# Patient Record
Sex: Female | Born: 1995 | Race: White | Hispanic: No
Health system: Western US, Academic
[De-identification: ages and names within clinical notes are randomized; demographics above are authoritative.]

## PROBLEM LIST (undated history)

## (undated) DIAGNOSIS — R51 Headache: Secondary | ICD-10-CM

## (undated) DIAGNOSIS — Z973 Presence of spectacles and contact lenses: Secondary | ICD-10-CM

## (undated) DIAGNOSIS — J302 Other seasonal allergic rhinitis: Secondary | ICD-10-CM

## (undated) DIAGNOSIS — K219 Gastro-esophageal reflux disease without esophagitis: Secondary | ICD-10-CM

## (undated) DIAGNOSIS — R519 Headache, unspecified: Secondary | ICD-10-CM

## (undated) DIAGNOSIS — T7840XA Allergy, unspecified, initial encounter: Secondary | ICD-10-CM

## (undated) DIAGNOSIS — J189 Pneumonia, unspecified organism: Secondary | ICD-10-CM

## (undated) HISTORY — PX: MOUTH SURGERY: SHX715

## (undated) HISTORY — DX: Allergy, unspecified, initial encounter: T78.40XA

## (undated) HISTORY — PX: PAROTIDECTOMY: SHX2163

---

## 2013-01-05 ENCOUNTER — Encounter (HOSPITAL_COMMUNITY): Payer: Self-pay | Admitting: *Deleted

## 2013-01-05 DIAGNOSIS — R6883 Chills (without fever): Secondary | ICD-10-CM | POA: Insufficient documentation

## 2013-01-05 DIAGNOSIS — M545 Low back pain, unspecified: Secondary | ICD-10-CM | POA: Insufficient documentation

## 2013-01-05 DIAGNOSIS — R300 Dysuria: Secondary | ICD-10-CM | POA: Insufficient documentation

## 2013-01-05 DIAGNOSIS — R350 Frequency of micturition: Secondary | ICD-10-CM | POA: Insufficient documentation

## 2013-01-05 DIAGNOSIS — N390 Urinary tract infection, site not specified: Secondary | ICD-10-CM | POA: Insufficient documentation

## 2013-01-05 DIAGNOSIS — Z3202 Encounter for pregnancy test, result negative: Secondary | ICD-10-CM | POA: Insufficient documentation

## 2013-01-05 LAB — POCT PREGNANCY, URINE: Preg Test, Ur: NEGATIVE

## 2013-01-05 NOTE — ED Notes (Addendum)
Lt. Lower back pain; the pain started in stomach. Urine did have a foul odor x 2 weeks ago and cloudy but resolved.

## 2013-01-06 ENCOUNTER — Emergency Department (HOSPITAL_COMMUNITY)
Admission: EM | Admit: 2013-01-06 | Discharge: 2013-01-06 | Disposition: A | Discharge status: Home / Self Care | Payer: Medicaid Other | Attending: Emergency Medicine | Admitting: Emergency Medicine

## 2013-01-06 DIAGNOSIS — N390 Urinary tract infection, site not specified: Secondary | ICD-10-CM

## 2013-01-06 LAB — URINALYSIS, ROUTINE W REFLEX MICROSCOPIC
Glucose, UA: NEGATIVE mg/dL
Protein, ur: NEGATIVE mg/dL
Specific Gravity, Urine: 1.007 (ref 1.005–1.030)

## 2013-01-06 LAB — URINE MICROSCOPIC-ADD ON

## 2013-01-06 MED ORDER — NITROFURANTOIN MONOHYD MACRO 100 MG PO CAPS: 100.0000 mg | ORAL_CAPSULE | Freq: Two times a day (BID) | ORAL | Status: DC

## 2013-01-06 NOTE — ED Notes (Signed)
Pt states understanding of discharge instructions 

## 2013-01-06 NOTE — ED Provider Notes (Signed)
History     CSN: [id number]  Arrival date & time 01/05/13  2318   First MD Initiated Contact with Patient 01/06/13 0121      Chief Complaint  Patient presents with  . Abdominal Pain   HPI  History provided by the patient. Patient is 17 year old female with no significant PMH who presents with complaints of left lower abdomen and back pains and discomfort. Patient also reports having a foul odor and slight dysuria for the past 2 weeks. Back pains and abdominal pains began over the past one to 2 days. Patient has not used any treatments for her symptoms. She denies any other aggravating or alleviating factors. Denies any menstrual changes. Last menstrual period was on the 11th. Denies any current vaginal bleeding or discharge. No other associated symptoms. No fever, chills or sweats. No nausea or vomiting.    History reviewed. No pertinent past medical history.  History reviewed. No pertinent past surgical history.  No family history on file.  History  Substance Use Topics  . Smoking status: Never Smoker   . Smokeless tobacco: Not on file  . Alcohol Use: No    OB History   Grav Para Term Preterm Abortions TAB SAB Ect Mult Living                  Review of Systems  Constitutional: Positive for chills. Negative for fever and diaphoresis.  Gastrointestinal: Positive for abdominal pain. Negative for nausea, vomiting and constipation.  Genitourinary: Positive for dysuria, frequency and flank pain. Negative for hematuria.  Musculoskeletal: Positive for back pain.  All other systems reviewed and are negative.    Allergies  Doxycycline  Home Medications   Current Outpatient Rx  Name  Route  Sig  Dispense  Refill  . Drospiren-Eth Estrad-Levomefol (BEYAZ PO)   Oral   Take 1 tablet by mouth daily.         Marland Kitchen ibuprofen (ADVIL,MOTRIN) 200 MG tablet   Oral   Take 400 mg by mouth every 6 (six) hours as needed for pain.           BP 140/89  Pulse 78  Temp(Src) 98.2  F (36.8 C) (Oral)  Resp 20  SpO2 100%  LMP 12/21/2012  Physical Exam  Nursing note and vitals reviewed. Constitutional: She is oriented to person, place, and time. She appears well-developed and well-nourished. No distress.  HENT:  Head: Normocephalic.  Cardiovascular: Normal rate and regular rhythm.   Pulmonary/Chest: Effort normal and breath sounds normal.  Abdominal: Soft. There is tenderness in the left lower quadrant. There is no rebound, no guarding, no CVA tenderness, no tenderness at McBurney's point and negative Murphy's sign.  Pain is mild. No significance left CVA tenderness.  Musculoskeletal: Normal range of motion.  Neurological: She is alert and oriented to person, place, and time.  Skin: Skin is warm and dry. No rash noted.  Psychiatric: She has a normal mood and affect. Her behavior is normal.    ED Course  Procedures   Results for orders placed during the hospital encounter of 01/06/13  URINALYSIS, ROUTINE W REFLEX MICROSCOPIC      Result Value Range   Color, Urine YELLOW  YELLOW   APPearance CLEAR  CLEAR   Specific Gravity, Urine 1.007  1.005 - 1.030   pH 7.0  5.0 - 8.0   Glucose, UA NEGATIVE  NEGATIVE mg/dL   Hgb urine dipstick MODERATE (*) NEGATIVE   Bilirubin Urine NEGATIVE  NEGATIVE   Ketones,  ur NEGATIVE  NEGATIVE mg/dL   Protein, ur NEGATIVE  NEGATIVE mg/dL   Urobilinogen, UA 0.2  0.0 - 1.0 mg/dL   Nitrite NEGATIVE  NEGATIVE   Leukocytes, UA MODERATE (*) NEGATIVE  URINE MICROSCOPIC-ADD ON      Result Value Range   Squamous Epithelial / LPF MANY (*) RARE   WBC, UA 11-20  <3 WBC/hpf   RBC / HPF 7-10  <3 RBC/hpf   Bacteria, UA FEW (*) RARE  POCT PREGNANCY, URINE      Result Value Range   Preg Test, Ur NEGATIVE  NEGATIVE        1. UTI (lower urinary tract infection)       MDM  1:20AM patient seen and evaluated. Patient well-appearing in no acute distress. She is not appears fairly or toxic. Symptoms may suggest slight early  pyelonephritis though patient clinically does not appear to have this yet. She denies any vaginal complaints does not wish to have pelvic examination for other causes. Will treat as UTI and patient will followup with her doctors.        Angus Seller, PA-C 01/06/13 [phone]

## 2013-01-07 LAB — URINE CULTURE

## 2013-01-07 NOTE — ED Provider Notes (Signed)
Medical screening examination/treatment/procedure(s) were performed by non-physician practitioner and as supervising physician I was immediately available for consultation/collaboration.  John-Adam Aundrey Elahi, M.D.   John-Adam Izell Labat, MD 01/07/13 0749 

## 2013-01-08 ENCOUNTER — Telehealth (HOSPITAL_COMMUNITY): Payer: Self-pay | Admitting: Emergency Medicine

## 2013-01-08 NOTE — ED Notes (Signed)
Results received from [facility]. (+) [facility].  Rx given for Nitrofurantoin -> No sensitivities provided.  Chart to MD office for review.

## 2013-01-12 ENCOUNTER — Telehealth (HOSPITAL_COMMUNITY): Payer: Self-pay | Admitting: Emergency Medicine

## 2013-01-12 NOTE — ED Notes (Signed)
Chart returned from EDP office. Per Emily West PA-C, likely contaminant. No further abx tx. °

## 2013-11-28 ENCOUNTER — Encounter (HOSPITAL_COMMUNITY): Payer: Self-pay | Admitting: Emergency Medicine

## 2013-11-28 ENCOUNTER — Emergency Department (HOSPITAL_COMMUNITY)
Admission: EM | Admit: 2013-11-28 | Discharge: 2013-11-28 | Disposition: A | Discharge status: Home / Self Care | Payer: BC Managed Care – PPO | Source: Home / Self Care | Attending: Family Medicine | Admitting: Family Medicine

## 2013-11-28 DIAGNOSIS — J029 Acute pharyngitis, unspecified: Secondary | ICD-10-CM

## 2013-11-28 DIAGNOSIS — J309 Allergic rhinitis, unspecified: Secondary | ICD-10-CM

## 2013-11-28 LAB — POCT RAPID STREP A: Streptococcus, Group A Screen (Direct): NEGATIVE

## 2013-11-28 MED ORDER — PREDNISONE 10 MG PO TABS: ORAL_TABLET | ORAL | Status: DC

## 2013-11-28 NOTE — ED Notes (Signed)
Pt c/o sore throat onset Sunday Also states she was nauseas beg of this week and started to vomit Denies f/d Taking cough drops w/no relief Alert w/no signs of acute distress.

## 2013-11-28 NOTE — Discharge Instructions (Signed)
Sore Throat Warm salt water gargles daily. 1 tsp liquid benadryl + 1 tsp liquid Maalox, MIX/ GARGLE/ SPIT as needed for pain  A sore throat is a painful, burning, sore, or scratchy feeling of the throat. There may be pain or tenderness when swallowing or talking. You may have other symptoms with a sore throat. These include coughing, sneezing, fever, or a swollen neck. A sore throat is often the first sign of another sickness. These sicknesses may include a cold, flu, strep throat, or an infection called mono. Most sore throats go away without medical treatment.  HOME CARE   Only take medicine as told by your doctor.  Drink enough fluids to keep your pee (urine) clear or pale yellow.  Rest as needed.  Try using throat sprays, lozenges, or suck on hard candy (if older than 4 years or as told).  Sip warm liquids, such as broth, herbal tea, or warm water with honey. Try sucking on frozen ice pops or drinking cold liquids.  Rinse the mouth (gargle) with salt water. Mix 1 teaspoon salt with 8 ounces of water.  Do not smoke. Avoid being around others when they are smoking.  Put a humidifier in your bedroom at night to moisten the air. You can also turn on a hot shower and sit in the bathroom for 5 10 minutes. Be sure the bathroom door is closed. GET HELP RIGHT AWAY IF:   You have trouble breathing.  You cannot swallow fluids, soft foods, or your spit (saliva).  You have more puffiness (swelling) in the throat.  Your sore throat does not get better in 7 days.  You feel sick to your stomach (nauseous) and throw up (vomit).  You have a fever or lasting symptoms for more than 2 3 days.  You have a fever and your symptoms suddenly get worse. MAKE SURE YOU:   Understand these instructions.  Will watch your condition.  Will get help right away if you are not doing well or get worse. Document Released: 06/07/2008 Document Revised: 05/23/2012 Document Reviewed: 05/06/2012 [facility]  Patient Information 2014 [place], [place]. Allergic Rhinitis Allergic rhinitis is when the mucous membranes in the nose respond to allergens. Allergens are particles in the air that cause your body to have an allergic reaction. This causes you to release allergic antibodies. Through a chain of events, these eventually cause you to release histamine into the blood stream. Although meant to protect the body, it is this release of histamine that causes your discomfort, such as frequent sneezing, congestion, and an itchy, runny nose.  CAUSES  Seasonal allergic rhinitis (hay fever) is caused by pollen allergens that may come from grasses, trees, and weeds. Year-round allergic rhinitis (perennial allergic rhinitis) is caused by allergens such as house dust mites, pet dander, and mold spores.  SYMPTOMS   Nasal stuffiness (congestion).  Itchy, runny nose with sneezing and tearing of the eyes. DIAGNOSIS  Your health care provider can help you determine the allergen or allergens that trigger your symptoms. If you and your health care provider are unable to determine the allergen, skin or blood testing may be used. TREATMENT  Allergic Rhinitis does not have a cure, but it can be controlled by:  Medicines and allergy shots (immunotherapy).  Avoiding the allergen. Hay fever may often be treated with antihistamines in pill or nasal spray forms. Antihistamines block the effects of histamine. There are over-the-counter medicines that may help with nasal congestion and swelling around the eyes. Check with your  health care provider before taking or giving this medicine.  If avoiding the allergen or the medicine prescribed do not work, there are many new medicines your health care provider can prescribe. Stronger medicine may be used if initial measures are ineffective. Desensitizing injections can be used if medicine and avoidance does not work. Desensitization is when a patient is given ongoing shots until the body  becomes less sensitive to the allergen. Make sure you follow up with your health care provider if problems continue. HOME CARE INSTRUCTIONS It is not possible to completely avoid allergens, but you can reduce your symptoms by taking steps to limit your exposure to them. It helps to know exactly what you are allergic to so that you can avoid your specific triggers. SEEK MEDICAL CARE IF:   You have a fever.  You develop a cough that does not stop easily (persistent).  You have shortness of breath.  You start wheezing.  Symptoms interfere with normal daily activities. Document Released: 05/24/2001 Document Revised: 06/19/2013 Document Reviewed: 05/06/2013 [facility] Patient Information 2014 [place].

## 2013-11-28 NOTE — ED Provider Notes (Signed)
CSN: [id number]     Arrival date & time 11/28/13  1507 History   First MD Initiated Contact with Patient 11/28/13 1719     Chief Complaint  Patient presents with  . Sore Throat   (Consider location/radiation/quality/duration/timing/severity/associated sxs/prior Treatment) HPI Comments: 18 yo with sore throat/ HA since Sunday. She noted mild nausea and vomitus which have improved. She denies relief with Ibuprofen. She has felt mild fever/ chills. She denies appetite change and is drinking fluids.    History reviewed. No pertinent past medical history. Past Surgical History  Procedure Laterality Date  . Mouth surgery     No family history on file. History  Substance Use Topics  . Smoking status: Never Smoker   . Smokeless tobacco: Not on file  . Alcohol Use: No   OB History   Grav Para Term Preterm Abortions TAB SAB Ect Mult Living                 Review of Systems  Constitutional: Positive for fever and fatigue.  HENT: Positive for postnasal drip and sore throat.   Gastrointestinal: Positive for nausea.  All other systems reviewed and are negative.    Allergies  Doxycycline  Home Medications   Current Outpatient Rx  Name  Route  Sig  Dispense  Refill  . Drospiren-Eth Estrad-Levomefol (BEYAZ PO)   Oral   Take 1 tablet by mouth daily.         Marland Kitchen ibuprofen (ADVIL,MOTRIN) 200 MG tablet   Oral   Take 400 mg by mouth every 6 (six) hours as needed for pain.         . nitrofurantoin, macrocrystal-monohydrate, (MACROBID) 100 MG capsule   Oral   Take 1 capsule (100 mg total) by mouth 2 (two) times daily. X 7 days   14 capsule   0    BP 130/75  Pulse 68  Temp(Src) 97.7 F (36.5 C) (Oral)  Resp 12  SpO2 100%  LMP 11/19/2013 Physical Exam  Nursing note and vitals reviewed. Constitutional: She is oriented to person, place, and time. She appears well-developed and well-nourished.  HENT:  Head: Normocephalic and atraumatic.  Right Ear: External ear normal.   Left Ear: External ear normal.  Nose: Nose normal.  Mouth/Throat: Oropharynx is clear and moist. No oropharyngeal exudate.  Cloudy TM's bilaterally Cobbl[place] posterior pharynx NO exudate/ erythema  Eyes: Conjunctivae and EOM are normal.  Neck: Normal range of motion.  Cardiovascular: Normal rate, regular rhythm, normal heart sounds and intact distal pulses.   Pulmonary/Chest: Effort normal and breath sounds normal.  Musculoskeletal: Normal range of motion.  Lymphadenopathy:    She has no cervical adenopathy.  Neurological: She is alert and oriented to person, place, and time.  Skin: Skin is warm and dry.  Psychiatric: She has a normal mood and affect. Judgment normal.    ED Course  Procedures (including critical care time) Labs Review Labs Reviewed  POCT RAPID STREP A ([facility] URG CARE ONLY)   Imaging Review No results found.   MDM  Viral infection/ Sore throat/ Allergic rhinitis- Allegra OTC, increase H2o, allergy hygiene explained. Strept NEG will send for culture. Advised of hygiene, increase fluids, Warm salt water gargles daily. 1 tsp liquid benadryl + 1 tsp liquid Maalox, MIX/ GARGLE/ SPIT as needed for pain    Ardis Hughs, PA-C 11/28/13 2108

## 2013-11-29 NOTE — ED Provider Notes (Signed)
Medical screening examination/treatment/procedure(s) were performed by resident physician or non-physician practitioner and as supervising physician I was immediately available for consultation/collaboration.   Pauline Good MD.   Billy Fischer, MD 11/29/13 [phone]

## 2013-11-30 LAB — CULTURE, GROUP A STREP

## 2014-03-17 ENCOUNTER — Ambulatory Visit (INDEPENDENT_AMBULATORY_CARE_PROVIDER_SITE_OTHER): Payer: BC Managed Care – PPO | Admitting: Physician Assistant

## 2014-03-17 VITALS — BP 118/72 | HR 72 | Temp 98.1°F | Resp 16 | Ht 63.0 in | Wt 117.6 lb

## 2014-03-17 DIAGNOSIS — M25561 Pain in right knee: Secondary | ICD-10-CM

## 2014-03-17 DIAGNOSIS — M25569 Pain in unspecified knee: Secondary | ICD-10-CM

## 2014-03-17 DIAGNOSIS — M25571 Pain in right ankle and joints of right foot: Secondary | ICD-10-CM

## 2014-03-17 DIAGNOSIS — M25579 Pain in unspecified ankle and joints of unspecified foot: Secondary | ICD-10-CM

## 2014-03-17 MED ORDER — MELOXICAM 15 MG PO TABS: 15.0000 mg | ORAL_TABLET | Freq: Every day | ORAL | Status: DC

## 2014-03-17 NOTE — Progress Notes (Signed)
   Subjective:    Patient ID: Tracy West, female    DOB: 1996-04-20, 18 y.o.   MRN: [id number]  HPI 18  year old female presents for evaluation of right foot and knee pain. States she has had knee pain for about 4 years intermittently. Wears a brace as needed and will also take Advil prn severe pain. Has never had this evaluated. No known injury.  She was doing ok until about 1 week ago when the pain increased. 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   Also had and episode of pain in her right 2nd toe. No known injury. Does admit that over the past week her pain has improved somewhat. States she feels like the pain radiates up her leg and to her knee.  No limping or change in gait. Denies any weakness or paresthesias. No back pain. Has full ROM.    She is otherwise healthy with no other concerns today.  No daily medications.   Review of Systems  Musculoskeletal: Positive for arthralgias. Negative for joint swelling.  Skin: Negative for rash.  Neurological: Negative for weakness and numbness.       Objective:   Physical Exam  Constitutional: She is oriented to person, place, and time. She appears well-developed and well-nourished.  HENT:  Head: Normocephalic and atraumatic.  Right Ear: External ear normal.  Left Ear: External ear normal.  Eyes: Conjunctivae are normal.  Neck: Normal range of motion.  Cardiovascular: Normal rate.   Pulmonary/Chest: Effort normal.  Musculoskeletal:       Right knee: She exhibits normal range of motion, no swelling, no effusion, no ecchymosis, no deformity, no laceration, no erythema, normal alignment, no LCL laxity, normal patellar mobility and no bony tenderness. No tenderness found. No medial joint line, no lateral joint line, no MCL, no LCL and no patellar tendon tenderness noted.       Legs: Pain in noted area when ambulating. Not reproducible on exam. Right foot +TTP at 2nd MTP joint. Not point tender.  Full ROM without pain.  Capillary refill normal <2 seconds  Neurological: She is alert and oriented to person, place, and time.  Psychiatric: She has a normal mood and affect. Her behavior is normal. Judgment and thought content normal.          Assessment & Plan:  Knee pain, acute, right - Plan: meloxicam (MOBIC) 15 MG tablet  Pain in joint, ankle and foot, right  Plan to treat conservatively with Mobic 15 mg daily with food Continue to wear knee brace as needed.  Ice, elevate, and rest Recheck in 10-14 days if no improvement, sooner if worse. Consider x-rays/ortho eval at that time.

## 2014-11-17 ENCOUNTER — Other Ambulatory Visit: Payer: Self-pay | Admitting: Family Medicine

## 2014-11-17 DIAGNOSIS — R200 Anesthesia of skin: Secondary | ICD-10-CM

## 2014-11-17 DIAGNOSIS — R202 Paresthesia of skin: Principal | ICD-10-CM

## 2014-11-20 ENCOUNTER — Ambulatory Visit
Admission: RE | Admit: 2014-11-20 | Discharge: 2014-11-20 | Disposition: A | Discharge status: Home / Self Care | Payer: BLUE CROSS/BLUE SHIELD | Source: Ambulatory Visit | Attending: Family Medicine | Admitting: Family Medicine

## 2014-11-20 ENCOUNTER — Other Ambulatory Visit: Payer: Self-pay | Admitting: Family Medicine

## 2014-11-20 DIAGNOSIS — R202 Paresthesia of skin: Principal | ICD-10-CM

## 2014-11-20 DIAGNOSIS — M79602 Pain in left arm: Secondary | ICD-10-CM

## 2014-11-20 DIAGNOSIS — S0300XA Dislocation of jaw, unspecified side, initial encounter: Secondary | ICD-10-CM

## 2014-11-20 DIAGNOSIS — R200 Anesthesia of skin: Secondary | ICD-10-CM

## 2014-11-24 ENCOUNTER — Other Ambulatory Visit: Payer: Self-pay | Admitting: Family Medicine

## 2014-11-24 DIAGNOSIS — IMO0002 Reserved for concepts with insufficient information to code with codable children: Secondary | ICD-10-CM

## 2014-11-24 DIAGNOSIS — R229 Localized swelling, mass and lump, unspecified: Principal | ICD-10-CM

## 2014-11-27 ENCOUNTER — Ambulatory Visit
Admission: RE | Admit: 2014-11-27 | Discharge: 2014-11-27 | Disposition: A | Discharge status: Home / Self Care | Payer: BLUE CROSS/BLUE SHIELD | Source: Ambulatory Visit | Attending: Family Medicine | Admitting: Family Medicine

## 2014-11-27 DIAGNOSIS — R229 Localized swelling, mass and lump, unspecified: Principal | ICD-10-CM

## 2014-11-27 DIAGNOSIS — IMO0002 Reserved for concepts with insufficient information to code with codable children: Secondary | ICD-10-CM

## 2014-11-27 MED ORDER — GADOBENATE DIMEGLUMINE 529 MG/ML IV SOLN
11.0000 mL | Freq: Once | INTRAVENOUS | Status: AC | PRN
  Administered 2014-11-27: 11 mL via INTRAVENOUS

## 2014-12-02 ENCOUNTER — Other Ambulatory Visit (HOSPITAL_COMMUNITY)
Admission: RE | Admit: 2014-12-02 | Discharge: 2014-12-02 | Disposition: A | Discharge status: Home / Self Care | Payer: BLUE CROSS/BLUE SHIELD | Source: Ambulatory Visit | Attending: Otolaryngology | Admitting: Otolaryngology

## 2014-12-02 ENCOUNTER — Other Ambulatory Visit: Payer: Self-pay | Admitting: Otolaryngology

## 2014-12-02 DIAGNOSIS — D110 Benign neoplasm of parotid gland: Secondary | ICD-10-CM | POA: Diagnosis present

## 2014-12-04 ENCOUNTER — Other Ambulatory Visit: Payer: BLUE CROSS/BLUE SHIELD

## 2014-12-18 ENCOUNTER — Ambulatory Visit: Payer: Self-pay | Admitting: Otolaryngology

## 2014-12-18 NOTE — H&P (Signed)
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 Discussed  Slowly enlarging left parotid mass, with MRI features concerning for malignancy and possible lymphadenopathy. FNA performed today. She will require parotidectomy with nerve dissection, possible partial neck dissection. Risks and benefits of the surgery were discussed in detail. All questions were answered. Reason For Visit  Tracy West is here today at the kind request of Rachell Cipro for consultation and opinion. Mass behind ear. 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 Recent MRI revealed a 3.7 cm mass involving the superficial and deep lobe with some associated upper cervical lymphadenopathy. No other pertinent history. Allergies  Doxycycline Monohydrate CAPS. Current Meds  No Reported Medications;; RPT. Active Problems  No active medical problems. [place]  Ear Surgery Oral Surgery Tooth Extraction. Family Hx  Family history of cardiac disorder: Grandmother (V17.49) (Z82.49) Family history of diabetes mellitus: Grandmother (V18.0) (Z83.3) Family history of essential hypertension: Grandmother (V17.49) (Z82.49) Family history of lymphoma: Grandmother (V16.7) (Z80.2) No pertinent family history: Mother. Personal Hx  Never a smoker. ROS  Systemic: Feeling tired (fatigue).  No fever, no night sweats, and no recent weight loss. Head: No headache. Eyes: No eye symptoms. Otolaryngeal: No hearing loss, no earache, no tinnitus, and no purulent nasal discharge.  No nasal passage blockage (stuffiness), no snoring, and no sneezing.  Hoarseness  and sore throat. Cardiovascular: No chest pain or discomfort  and no palpitations. Pulmonary: No dyspnea, no cough, and no wheezing. Gastrointestinal: No dysphagia  and no heartburn.  No nausea, no abdominal pain, and no melena.  No diarrhea. Genitourinary: No dysuria. Endocrine: No muscle weakness. Musculoskeletal: No calf muscle cramps, no arthralgias, and no soft  tissue swelling. Neurological: No dizziness, no fainting, no tingling, and no numbness. Psychological: No anxiety  and no depression. Skin: No rash. 12 system ROS was obtained and reviewed on the Health Maintenance form dated today.  Positive responses are shown above.  If the symptom is not checked, the patient has denied it. Vital Signs   Recorded by Rogers,Lisa on 02 Dec 2014 03:06 PM BP:114/64,  Height: 5 ft 3 in, Weight: 125 lb , BMI: 22.1 kg/m2,  BMI Calculated: 22.14 ,  BSA Calculated: 1.58. Physical Exam  APPEARANCE: Well developed, well nourished, in no acute distress.  Normal affect, in a pleasant mood.  Oriented to time, place and person. COMMUNICATION: Normal voice   HEAD & FACE:  No scars, lesions or masses of head and face.  Sinuses nontender to palpation.  Salivary glands without mass or tenderness, except for a large firm mass involving the left parotid.  Facial strength symmetric.  No facial lesion, scars, or mass. EYES: EOMI with normal primary gaze alignment. Visual acuity grossly intact.  PERRLA EXTERNAL EAR & NOSE: No scars, lesions or masses  EAC & TYMPANIC MEMBRANE:  EAC shows no obstructing lesions or debris and tympanic membranes are normal bilaterally with good movement to insufflation. GROSS HEARING: Normal   TMJ:  Nontender  INTRANASAL EXAM: No polyps or purulence.  NASOPHARYNX: Normal, without lesions. LIPS, TEETH & GUMS: No lip lesions, normal dentition and normal gums. ORAL CAVITY/OROPHARYNX:  Oral mucosa moist without lesion or asymmetry of the palate, tongue, tonsil or posterior pharynx. NECK:  Supple without adenopathy or mass. THYROID:  Normal with no masses palpable.  NEUROLOGIC:  No gross CN deficits. No nystagmus noted.   LYMPHATIC:  No enlarged nodes palpable. Procedure  FNA The risks and benefits of this procedure have been thoroughly discussed with the  patient.  The most commons risks outlined included but were not limited to: injury  to the  nasal mucosa or throat irritation.  The patient was further informed that there are other less common risks.  The patient was given the opportunity to ask questions and all such questions were answered to the patient's satisfaction.  Patient acknowledged the risks and has agreed to proceed.   Preop Diagnosis:  Procedure: Using a 10 cc syringe with a 22 gauge needle, the mass aspirated. This was repeated with a separate needle a second time. Cytology samples were prepared. A dressing was applied. Tolerance: Excellent Unplanned interventions: None  Unplanned events: No complications. Signature  Electronically signed by : Izora Gala  M.D.; 12/02/2014 3:39 PM EST.

## 2014-12-19 ENCOUNTER — Encounter (HOSPITAL_COMMUNITY): Payer: Self-pay

## 2014-12-19 ENCOUNTER — Encounter (HOSPITAL_COMMUNITY)
Admission: RE | Admit: 2014-12-19 | Discharge: 2014-12-19 | Disposition: A | Discharge status: Home / Self Care | Payer: BLUE CROSS/BLUE SHIELD | Source: Ambulatory Visit | Attending: Otolaryngology | Admitting: Otolaryngology

## 2014-12-19 DIAGNOSIS — D490 Neoplasm of unspecified behavior of digestive system: Secondary | ICD-10-CM | POA: Diagnosis not present

## 2014-12-19 DIAGNOSIS — Z01812 Encounter for preprocedural laboratory examination: Secondary | ICD-10-CM | POA: Insufficient documentation

## 2014-12-19 HISTORY — DX: Presence of spectacles and contact lenses: Z97.3

## 2014-12-19 HISTORY — DX: Headache: R51

## 2014-12-19 HISTORY — DX: Other seasonal allergic rhinitis: J30.2

## 2014-12-19 HISTORY — DX: Gastro-esophageal reflux disease without esophagitis: K21.9

## 2014-12-19 HISTORY — DX: Headache, unspecified: R51.9

## 2014-12-19 HISTORY — DX: Pneumonia, unspecified organism: J18.9

## 2014-12-19 LAB — CBC
HEMATOCRIT: 35.3 % — AB (ref 36.0–46.0)
Hemoglobin: 11.2 g/dL — ABNORMAL LOW (ref 12.0–15.0)
MCH: 25.5 pg — AB (ref 26.0–34.0)
MCHC: 31.7 g/dL (ref 30.0–36.0)
MCV: 80.4 fL (ref 78.0–100.0)
Platelets: 213 10*3/uL (ref 150–400)
RBC: 4.39 MIL/uL (ref 3.87–5.11)
RDW: 15 % (ref 11.5–15.5)
WBC: 5.5 10*3/uL (ref 4.0–10.5)

## 2014-12-19 LAB — BASIC METABOLIC PANEL
Anion gap: 8 (ref 5–15)
BUN: 13 mg/dL (ref 6–23)
CO2: 25 mmol/L (ref 19–32)
Calcium: 9.5 mg/dL (ref 8.4–10.5)
Chloride: 104 mmol/L (ref 96–112)
Creatinine, Ser: 0.62 mg/dL (ref 0.50–1.10)
GFR calc Af Amer: >90 mL/min (ref >90)
GLUCOSE: 87 mg/dL (ref 70–99)
POTASSIUM: 4 mmol/L (ref 3.5–5.1)
Sodium: 137 mmol/L (ref 135–145)

## 2014-12-19 LAB — HCG, SERUM, QUALITATIVE: Preg, Serum: NEGATIVE

## 2014-12-19 NOTE — Pre-Procedure Instructions (Signed)
Tracy West  12/19/2014   Your procedure is scheduled on: Friday, December 26, 2014  Report to [facility] Admitting at 6:45 AM.  Call this number if you have problems the morning of surgery: [phone]   Remember:   Do not eat food or drink liquids after midnight Thursday, December 25, 2014   Take these medicines the morning of surgery with A SIP OF WATER: none  Stop taking Aspirin, vitamins and herbal medications. Do not take any NSAIDs ie: Ibuprofen, Advil, Naproxen or any medication containing Aspirin; stop now.   Do not wear jewelry, make-up or nail polish.  Do not wear lotions, powders, or perfumes. You may not wear deodorant.  Do not shave 48 hours prior to surgery.   Do not bring valuables to the hospital.  [facility] is not responsible for any belongings or valuables.               Contacts, dentures or bridgework may not be worn into surgery.  Leave suitcase in the car. After surgery it may be brought to your room.  For patients admitted to the hospital, discharge time is determined by your treatment team.               Patients discharged the day of surgery will not be allowed to drive home.  Name and phone number of your driver:   Special Instructions:  Special Instructions:Special Instructions: [facility] - Preparing for Surgery  Before surgery, you can play an important role.  Because skin is not sterile, your skin needs to be as free of germs as possible.  You can reduce the number of germs on you skin by washing with CHG (chlorahexidine gluconate) soap before surgery.  CHG is an antiseptic cleaner which kills germs and bonds with the skin to continue killing germs even after washing.  Please DO NOT use if you have an allergy to CHG or antibacterial soaps.  If your skin becomes reddened/irritated stop using the CHG and inform your nurse when you arrive at Short Stay.  Do not shave (including legs and underarms) for at least 48 hours prior to the first CHG  shower.  You may shave your face.  Please follow these instructions carefully:   1.  Shower with CHG Soap the night before surgery and the morning of Surgery.  2.  If you choose to wash your hair, wash your hair first as usual with your normal shampoo.  3.  After you shampoo, rinse your hair and body thoroughly to remove the Shampoo.  4.  Use CHG as you would any other liquid soap.  You can apply chg directly  to the skin and wash gently with scrungie or a clean washcloth.  5.  Apply the CHG Soap to your body ONLY FROM THE NECK DOWN.  Do not use on open wounds or open sores.  Avoid contact with your eyes, ears, mouth and genitals (private parts).  Wash genitals (private parts) with your normal soap.  6.  Wash thoroughly, paying special attention to the area where your surgery will be performed.  7.  Thoroughly rinse your body with warm water from the neck down.  8.  DO NOT shower/wash with your normal soap after using and rinsing off the CHG Soap.  9.  Pat yourself dry with a clean towel.            10.  Wear clean pajamas.  11.  Place clean sheets on your bed the night of your first shower and do not sleep with pets.  Day of Surgery  Do not apply any lotions/deoderants the morning of surgery.  Please wear clean clothes to the hospital/surgery center.   Please read over the following fact sheets that you were given: Pain Booklet, Coughing and Deep Breathing and Surgical Site Infection Prevention

## 2014-12-19 NOTE — Progress Notes (Signed)
Pt denies SOB, chest pain, and being under the care of a cardiologist. Pt denies having an EKG within the last year. Pt denies having a stress test, echo and cardiac cath.

## 2014-12-25 MED ORDER — CEFAZOLIN SODIUM-DEXTROSE 2-3 GM-% IV SOLR
2.0000 g | INTRAVENOUS | Status: AC
  Administered 2014-12-26: 2 g via INTRAVENOUS
  Filled 2014-12-25: qty 50

## 2014-12-26 ENCOUNTER — Observation Stay (HOSPITAL_COMMUNITY)
Admission: RE | Admit: 2014-12-26 | Discharge: 2014-12-27 | Disposition: A | Discharge status: Home / Self Care | Payer: BLUE CROSS/BLUE SHIELD | Source: Ambulatory Visit | Attending: Otolaryngology | Admitting: Otolaryngology

## 2014-12-26 ENCOUNTER — Ambulatory Visit (HOSPITAL_COMMUNITY): Payer: BLUE CROSS/BLUE SHIELD | Admitting: Certified Registered Nurse Anesthetist

## 2014-12-26 ENCOUNTER — Encounter (HOSPITAL_COMMUNITY): Payer: Self-pay | Admitting: *Deleted

## 2014-12-26 ENCOUNTER — Encounter (HOSPITAL_COMMUNITY)
Admission: RE | Disposition: A | Discharge status: Home / Self Care | Payer: Self-pay | Source: Ambulatory Visit | Attending: Otolaryngology

## 2014-12-26 DIAGNOSIS — K118 Other diseases of salivary glands: Secondary | ICD-10-CM | POA: Diagnosis present

## 2014-12-26 DIAGNOSIS — D490 Neoplasm of unspecified behavior of digestive system: Secondary | ICD-10-CM | POA: Diagnosis present

## 2014-12-26 DIAGNOSIS — K219 Gastro-esophageal reflux disease without esophagitis: Secondary | ICD-10-CM | POA: Insufficient documentation

## 2014-12-26 DIAGNOSIS — D110 Benign neoplasm of parotid gland: Secondary | ICD-10-CM | POA: Diagnosis not present

## 2014-12-26 HISTORY — PX: PAROTIDECTOMY: SHX2163

## 2014-12-26 SURGERY — EXCISION, PAROTID GLAND
Anesthesia: General | Laterality: Left

## 2014-12-26 MED ORDER — ARTIFICIAL TEARS OP OINT
TOPICAL_OINTMENT | OPHTHALMIC | Status: AC
  Filled 2014-12-26: qty 3.5

## 2014-12-26 MED ORDER — ACETAMINOPHEN 650 MG RE SUPP: 650.0000 mg | RECTAL | Status: DC | PRN

## 2014-12-26 MED ORDER — DIPHENHYDRAMINE HCL 50 MG/ML IJ SOLN: 25.0000 mg | Freq: Four times a day (QID) | INTRAMUSCULAR | Status: DC | PRN

## 2014-12-26 MED ORDER — ONDANSETRON HCL 4 MG/2ML IJ SOLN: 4.0000 mg | Freq: Four times a day (QID) | INTRAMUSCULAR | Status: DC | PRN

## 2014-12-26 MED ORDER — ARTIFICIAL TEARS OP OINT
TOPICAL_OINTMENT | OPHTHALMIC | Status: DC | PRN
  Administered 2014-12-26: 1 via OPHTHALMIC

## 2014-12-26 MED ORDER — PROPOFOL 10 MG/ML IV BOLUS
INTRAVENOUS | Status: DC | PRN
  Administered 2014-12-26: 120 mg via INTRAVENOUS

## 2014-12-26 MED ORDER — ACETAMINOPHEN 325 MG PO TABS
ORAL_TABLET | ORAL | Status: AC
  Filled 2014-12-26: qty 2

## 2014-12-26 MED ORDER — PROMETHAZINE HCL 25 MG RE SUPP: 25.0000 mg | Freq: Four times a day (QID) | RECTAL | Status: DC | PRN

## 2014-12-26 MED ORDER — LIDOCAINE-EPINEPHRINE 1 %-1:100000 IJ SOLN
INTRAMUSCULAR | Status: DC | PRN
  Administered 2014-12-26: 3 mL

## 2014-12-26 MED ORDER — MEPERIDINE HCL 25 MG/ML IJ SOLN: 6.2500 mg | INTRAMUSCULAR | Status: DC | PRN

## 2014-12-26 MED ORDER — FENTANYL CITRATE (PF) 250 MCG/5ML IJ SOLN
INTRAMUSCULAR | Status: AC
  Filled 2014-12-26: qty 5

## 2014-12-26 MED ORDER — PROMETHAZINE HCL 25 MG RE SUPP: 25.0000 mg | Freq: Four times a day (QID) | RECTAL | Status: AC | PRN

## 2014-12-26 MED ORDER — LACTATED RINGERS IV SOLN
INTRAVENOUS | Status: DC
  Administered 2014-12-26 (×2): via INTRAVENOUS

## 2014-12-26 MED ORDER — GLYCOPYRROLATE 0.2 MG/ML IJ SOLN
INTRAMUSCULAR | Status: AC
  Filled 2014-12-26: qty 3

## 2014-12-26 MED ORDER — PHENOL 1.4 % MT LIQD: 2.0000 | Freq: Three times a day (TID) | OROMUCOSAL | Status: DC | PRN

## 2014-12-26 MED ORDER — MIDAZOLAM HCL 5 MG/5ML IJ SOLN
INTRAMUSCULAR | Status: DC | PRN
  Administered 2014-12-26: 2 mg via INTRAVENOUS

## 2014-12-26 MED ORDER — MIDAZOLAM HCL 2 MG/2ML IJ SOLN
INTRAMUSCULAR | Status: AC
  Filled 2014-12-26: qty 2

## 2014-12-26 MED ORDER — ROCURONIUM BROMIDE 50 MG/5ML IV SOLN
INTRAVENOUS | Status: AC
  Filled 2014-12-26: qty 1

## 2014-12-26 MED ORDER — LIDOCAINE HCL (CARDIAC) 20 MG/ML IV SOLN
INTRAVENOUS | Status: AC
  Filled 2014-12-26: qty 5

## 2014-12-26 MED ORDER — ONDANSETRON HCL 4 MG/2ML IJ SOLN
INTRAMUSCULAR | Status: AC
  Filled 2014-12-26: qty 2

## 2014-12-26 MED ORDER — LIDOCAINE-EPINEPHRINE 1 %-1:100000 IJ SOLN
INTRAMUSCULAR | Status: AC
  Filled 2014-12-26: qty 1

## 2014-12-26 MED ORDER — HYDROCODONE-ACETAMINOPHEN 5-325 MG PO TABS: 1.0000 | ORAL_TABLET | ORAL | Status: DC | PRN

## 2014-12-26 MED ORDER — NEOSTIGMINE METHYLSULFATE 10 MG/10ML IV SOLN
INTRAVENOUS | Status: AC
  Filled 2014-12-26: qty 1

## 2014-12-26 MED ORDER — 0.9 % SODIUM CHLORIDE (POUR BTL) OPTIME
TOPICAL | Status: DC | PRN
  Administered 2014-12-26: 1000 mL

## 2014-12-26 MED ORDER — ONDANSETRON HCL 4 MG/2ML IJ SOLN
4.0000 mg | Freq: Once | INTRAMUSCULAR | Status: DC | PRN
Start: 2014-12-26 — End: 2014-12-26

## 2014-12-26 MED ORDER — LIDOCAINE HCL 4 % MT SOLN
OROMUCOSAL | Status: DC | PRN
  Administered 2014-12-26: 4 mL via TOPICAL

## 2014-12-26 MED ORDER — MORPHINE SULFATE 2 MG/ML IJ SOLN
1.0000 mg | INTRAMUSCULAR | Status: DC | PRN
Start: 2014-12-26 — End: 2014-12-27

## 2014-12-26 MED ORDER — HYDROMORPHONE HCL 1 MG/ML IJ SOLN
INTRAMUSCULAR | Status: AC
  Filled 2014-12-26: qty 1

## 2014-12-26 MED ORDER — IBUPROFEN 400 MG PO TABS: 400.0000 mg | ORAL_TABLET | Freq: Four times a day (QID) | ORAL | Status: DC | PRN

## 2014-12-26 MED ORDER — PROMETHAZINE HCL 25 MG PO TABS: 25.0000 mg | ORAL_TABLET | Freq: Four times a day (QID) | ORAL | Status: DC | PRN

## 2014-12-26 MED ORDER — HYDROMORPHONE HCL 1 MG/ML IJ SOLN
0.2500 mg | INTRAMUSCULAR | Status: DC | PRN
  Administered 2014-12-26: 0.25 mg via INTRAVENOUS

## 2014-12-26 MED ORDER — SUCCINYLCHOLINE CHLORIDE 20 MG/ML IJ SOLN
INTRAMUSCULAR | Status: DC | PRN
  Administered 2014-12-26: 100 mg via INTRAVENOUS

## 2014-12-26 MED ORDER — BACITRACIN ZINC 500 UNIT/GM EX OINT
TOPICAL_OINTMENT | CUTANEOUS | Status: AC
  Filled 2014-12-26: qty 28.35

## 2014-12-26 MED ORDER — FENTANYL CITRATE (PF) 100 MCG/2ML IJ SOLN
INTRAMUSCULAR | Status: DC | PRN
  Administered 2014-12-26 (×3): 50 ug via INTRAVENOUS

## 2014-12-26 MED ORDER — PROPOFOL 10 MG/ML IV BOLUS
INTRAVENOUS | Status: AC
  Filled 2014-12-26: qty 20

## 2014-12-26 MED ORDER — ACETAMINOPHEN 160 MG/5ML PO SOLN
650.0000 mg | ORAL | Status: DC | PRN
  Administered 2014-12-26 – 2014-12-27 (×3): 650 mg via ORAL
  Filled 2014-12-26 (×2): qty 20.3

## 2014-12-26 MED ORDER — ONDANSETRON HCL 4 MG/2ML IJ SOLN
INTRAMUSCULAR | Status: DC | PRN
  Administered 2014-12-26: 4 mg via INTRAVENOUS

## 2014-12-26 MED ORDER — LIDOCAINE HCL (CARDIAC) 20 MG/ML IV SOLN
INTRAVENOUS | Status: DC | PRN
  Administered 2014-12-26: 100 mg via INTRAVENOUS

## 2014-12-26 MED ORDER — HYDROCODONE-ACETAMINOPHEN 7.5-325 MG PO TABS: 1.0000 | ORAL_TABLET | Freq: Four times a day (QID) | ORAL | Status: AC | PRN

## 2014-12-26 MED ORDER — DEXTROSE-NACL 5-0.9 % IV SOLN
INTRAVENOUS | Status: DC
  Administered 2014-12-26 – 2014-12-27 (×2): via INTRAVENOUS

## 2014-12-26 SURGICAL SUPPLY — 45 items
ATTRACTOMAT 16X20 MAGNETIC DRP (DRAPES) IMPLANT
BLADE SURG 15 STRL LF DISP TIS (BLADE) IMPLANT
BLADE SURG 15 STRL SS (BLADE)
CANISTER SUCTION 2500CC (MISCELLANEOUS) ×3 IMPLANT
CLEANER TIP ELECTROSURG 2X2 (MISCELLANEOUS) ×3 IMPLANT
CONT SPEC 4OZ CLIKSEAL STRL BL (MISCELLANEOUS) ×3 IMPLANT
CORDS BIPOLAR (ELECTRODE) ×3 IMPLANT
COVER SURGICAL LIGHT HANDLE (MISCELLANEOUS) ×3 IMPLANT
DERMABOND ADVANCED (GAUZE/BANDAGES/DRESSINGS) ×2
DERMABOND ADVANCED .7 DNX12 (GAUZE/BANDAGES/DRESSINGS) ×1 IMPLANT
DRAIN HEMOVAC 7FR (DRAIN) ×3 IMPLANT
DRAIN SNY 10 ROU (WOUND CARE) IMPLANT
DRAIN WOUND SNY 15 RND (WOUND CARE) IMPLANT
DRAPE INCISE 23X17 IOBAN STRL (DRAPES) ×2
DRAPE INCISE IOBAN 23X17 STRL (DRAPES) ×1 IMPLANT
DRAPE PROXIMA HALF (DRAPES) ×3 IMPLANT
ELECT COATED BLADE 2.86 ST (ELECTRODE) ×3 IMPLANT
ELECT REM PT RETURN 9FT ADLT (ELECTROSURGICAL) ×3
ELECTRODE REM PT RTRN 9FT ADLT (ELECTROSURGICAL) ×1 IMPLANT
EVACUATOR SILICONE 100CC (DRAIN) ×3 IMPLANT
FORCEPS BIPOLAR SPETZLER 8 1.0 (NEUROSURGERY SUPPLIES) ×3 IMPLANT
GAUZE SPONGE 4X4 16PLY XRAY LF (GAUZE/BANDAGES/DRESSINGS) ×3 IMPLANT
GLOVE BIOGEL PI IND STRL 7.5 (GLOVE) ×1 IMPLANT
GLOVE BIOGEL PI INDICATOR 7.5 (GLOVE) ×2
GLOVE ECLIPSE 7.5 STRL STRAW (GLOVE) ×3 IMPLANT
GOWN STRL REUS W/ TWL LRG LVL3 (GOWN DISPOSABLE) ×4 IMPLANT
GOWN STRL REUS W/TWL LRG LVL3 (GOWN DISPOSABLE) ×8
KIT BASIN OR (CUSTOM PROCEDURE TRAY) ×3 IMPLANT
KIT ROOM TURNOVER OR (KITS) ×3 IMPLANT
LOCATOR NERVE 3 VOLT (DISPOSABLE) ×3 IMPLANT
NEEDLE 27GAX1X1/2 (NEEDLE) ×3 IMPLANT
NEEDLE HYPO 25GX1X1/2 BEV (NEEDLE) IMPLANT
NS IRRIG 1000ML POUR BTL (IV SOLUTION) ×3 IMPLANT
PAD ARMBOARD 7.5X6 YLW CONV (MISCELLANEOUS) ×3 IMPLANT
PENCIL FOOT CONTROL (ELECTRODE) ×3 IMPLANT
SHEARS HARMONIC 9CM CVD (BLADE) ×3 IMPLANT
STAPLER VISISTAT 35W (STAPLE) ×3 IMPLANT
SUT CHROMIC 4 0 PS 2 18 (SUTURE) ×3 IMPLANT
SUT ETHILON 5 0 P 3 18 (SUTURE) ×2
SUT NYLON ETHILON 5-0 P-3 1X18 (SUTURE) ×1 IMPLANT
SUT SILK 2 0 SH CR/8 (SUTURE) ×3 IMPLANT
SUT SILK 4 0 REEL (SUTURE) IMPLANT
SYR CONTROL 10ML LL (SYRINGE) IMPLANT
TOWEL OR 17X24 6PK STRL BLUE (TOWEL DISPOSABLE) ×3 IMPLANT
TRAY ENT MC OR (CUSTOM PROCEDURE TRAY) ×3 IMPLANT

## 2014-12-26 NOTE — H&P (View-Only) (Signed)
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 Discussed  Slowly enlarging left parotid mass, with MRI features concerning for malignancy and possible lymphadenopathy. FNA performed today. She will require parotidectomy with nerve dissection, possible partial neck dissection. Risks and benefits of the surgery were discussed in detail. All questions were answered. Reason For Visit  Tracy West is here today at the kind request of Rachell Cipro for consultation and opinion. Mass behind ear. 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 Recent MRI revealed a 3.7 cm mass involving the superficial and deep lobe with some associated upper cervical lymphadenopathy. No other pertinent history. Allergies  Doxycycline Monohydrate CAPS. Current Meds  No Reported Medications;; RPT. Active Problems  No active medical problems. Earl  Ear Surgery Oral Surgery Tooth Extraction. Family Hx  Family history of cardiac disorder: Grandmother (V17.49) (Z82.49) Family history of diabetes mellitus: Grandmother (V18.0) (Z83.3) Family history of essential hypertension: Grandmother (V17.49) (Z82.49) Family history of lymphoma: Grandmother (V16.7) (Z80.2) No pertinent family history: Mother. Personal Hx  Never a smoker. ROS  Systemic: Feeling tired (fatigue).  No fever, no night sweats, and no recent weight loss. Head: No headache. Eyes: No eye symptoms. Otolaryngeal: No hearing loss, no earache, no tinnitus, and no purulent nasal discharge.  No nasal passage blockage (stuffiness), no snoring, and no sneezing.  Hoarseness  and sore throat. Cardiovascular: No chest pain or discomfort  and no palpitations. Pulmonary: No dyspnea, no cough, and no wheezing. Gastrointestinal: No dysphagia  and no heartburn.  No nausea, no abdominal pain, and no melena.  No diarrhea. Genitourinary: No dysuria. Endocrine: No muscle weakness. Musculoskeletal: No calf muscle cramps, no arthralgias, and no soft  tissue swelling. Neurological: No dizziness, no fainting, no tingling, and no numbness. Psychological: No anxiety  and no depression. Skin: No rash. 12 system ROS was obtained and reviewed on the Health Maintenance form dated today.  Positive responses are shown above.  If the symptom is not checked, the patient has denied it. Vital Signs   Recorded by Rogers,Lisa on 02 Dec 2014 03:06 PM BP:114/64,  Height: 5 ft 3 in, Weight: 125 lb , BMI: 22.1 kg/m2,  BMI Calculated: 22.14 ,  BSA Calculated: 1.58. Physical Exam  APPEARANCE: Well developed, well nourished, in no acute distress.  Normal affect, in a pleasant mood.  Oriented to time, place and person. COMMUNICATION: Normal voice   HEAD & FACE:  No scars, lesions or masses of head and face.  Sinuses nontender to palpation.  Salivary glands without mass or tenderness, except for a large firm mass involving the left parotid.  Facial strength symmetric.  No facial lesion, scars, or mass. EYES: EOMI with normal primary gaze alignment. Visual acuity grossly intact.  PERRLA EXTERNAL EAR & NOSE: No scars, lesions or masses  EAC & TYMPANIC MEMBRANE:  EAC shows no obstructing lesions or debris and tympanic membranes are normal bilaterally with good movement to insufflation. GROSS HEARING: Normal   TMJ:  Nontender  INTRANASAL EXAM: No polyps or purulence.  NASOPHARYNX: Normal, without lesions. LIPS, TEETH & GUMS: No lip lesions, normal dentition and normal gums. ORAL CAVITY/OROPHARYNX:  Oral mucosa moist without lesion or asymmetry of the palate, tongue, tonsil or posterior pharynx. NECK:  Supple without adenopathy or mass. THYROID:  Normal with no masses palpable.  NEUROLOGIC:  No gross CN deficits. No nystagmus noted.   LYMPHATIC:  No enlarged nodes palpable. Procedure  FNA The risks and benefits of this procedure have been thoroughly discussed with the  patient.  The most commons risks outlined included but were not limited to: injury  to the  nasal mucosa or throat irritation.  The patient was further informed that there are other less common risks.  The patient was given the opportunity to ask questions and all such questions were answered to the patient's satisfaction.  Patient acknowledged the risks and has agreed to proceed.   Preop Diagnosis:  Procedure: Using a 10 cc syringe with a 22 gauge needle, the mass aspirated. This was repeated with a separate needle a second time. Cytology samples were prepared. A dressing was applied. Tolerance: Excellent Unplanned interventions: None  Unplanned events: No complications. Signature  Electronically signed by : Izora Gala  M.D.; 12/02/2014 3:39 PM EST.

## 2014-12-26 NOTE — Transfer of Care (Signed)
Immediate Anesthesia Transfer of Care Note  Patient: Tracy West  Procedure(s) Performed: Procedure(s): LEFT PAROTIDECTOMY  (Left)  Patient Location: PACU  Anesthesia Type:General  Level of Consciousness: awake, oriented, patient cooperative and lethargic  Airway & Oxygen Therapy: Patient Spontanous Breathing and Patient connected to nasal cannula oxygen  Post-op Assessment: Report given to RN, Post -op Vital signs reviewed and stable and Patient moving all extremities  Post vital signs: Reviewed and stable  Last Vitals:  BP 114/85 HR 68 RR 20 SpO2 588% on 1L [place]  Complications: No apparent anesthesia complications

## 2014-12-26 NOTE — Anesthesia Procedure Notes (Signed)
Procedure Name: Intubation Date/Time: 12/26/2014 9:10 AM Performed by: Willeen Cass P Pre-anesthesia Checklist: Patient identified, Timeout performed, Emergency Drugs available, Suction available and Patient being monitored Patient Re-evaluated:Patient Re-evaluated prior to inductionOxygen Delivery Method: Circle system utilized Preoxygenation: Pre-oxygenation with 100% oxygen Intubation Type: IV induction Ventilation: Mask ventilation without difficulty Laryngoscope Size: Mac and 3 Grade View: Grade I Tube type: Oral Tube size: 7.0 mm Number of attempts: 1 Airway Equipment and Method: Stylet Placement Confirmation: ETT inserted through vocal cords under direct vision,  breath sounds checked- equal and bilateral and positive ETCO2 Secured at: 21 cm Tube secured with: Tape Dental Injury: Teeth and Oropharynx as per pre-operative assessment

## 2014-12-26 NOTE — Interval H&P Note (Signed)
History and Physical Interval Note:  12/26/2014 8:31 AM  Tracy West  has presented today for surgery, with the diagnosis of parotid neoplasm  The various methods of treatment have been discussed with the patient and family. After consideration of risks, benefits and other options for treatment, the patient has consented to  Procedure(s): [place] (Left) as a surgical intervention .  The patient's history has been reviewed, patient examined, no change in status, stable for surgery.  I have reviewed the patient's chart and labs.  Questions were answered to the patient's satisfaction.     Skarlett Sedlacek

## 2014-12-26 NOTE — Anesthesia Preprocedure Evaluation (Addendum)
Anesthesia Evaluation  Patient identified by MRN, date of birth, ID band Patient awake    Reviewed: Allergy & Precautions, NPO status , Patient's Chart, lab work & pertinent test results  History of Anesthesia Complications Negative for: history of anesthetic complications  Airway Mallampati: I  TM Distance: >3 FB Neck ROM: Full    Dental  (+) Teeth Intact, Dental Advisory Given   Pulmonary neg pulmonary ROS,    Pulmonary exam normal       Cardiovascular negative cardio ROS  Rhythm:Regular Rate:Normal     Neuro/Psych    GI/Hepatic negative GI ROS, Neg liver ROS, GERD-  Medicated and Controlled,  Endo/Other  negative endocrine ROS  Renal/GU negative Renal ROS     Musculoskeletal   Abdominal   Peds negative pediatric ROS (+)  Hematology   Anesthesia Other Findings   Reproductive/Obstetrics                            Anesthesia Physical Anesthesia Plan  ASA: II  Anesthesia Plan: General   Post-op Pain Management:    Induction: Intravenous  Airway Management Planned: Oral ETT  Additional Equipment:   Intra-op Plan:   Post-operative Plan: Extubation in OR  Informed Consent: I have reviewed the patients History and Physical, chart, labs and discussed the procedure including the risks, benefits and alternatives for the proposed anesthesia with the patient or authorized representative who has indicated his/her understanding and acceptance.     Plan Discussed with: CRNA and Surgeon  Anesthesia Plan Comments:         Anesthesia Quick Evaluation

## 2014-12-26 NOTE — Op Note (Signed)
OPERATIVE REPORT  DATE OF SURGERY: 12/26/2014  PATIENT:  Tracy West,  19 y.o. female  PRE-OPERATIVE DIAGNOSIS:  Left parotid mass  POST-OPERATIVE DIAGNOSIS:  Left parotid mass  PROCEDURE:  Procedure(s): LEFT PAROTIDECTOMY  SURGEON:  Beckie Salts, MD  ASSISTANTS: Jolene Provost PA  ANESTHESIA:   General   EBL:  20 ml  DRAINS: 7 French round  LOCAL MEDICATIONS USED:  1% Xylocaine with epinephrine  SPECIMEN:  Left parotidectomy  COUNTS:  Correct  PROCEDURE DETAILS: The patient was taken to the operating room and placed on the operating table in the supine position. Following induction of general endotracheal anesthesia, the left side of the face was prepped and draped in a standard fashion. A preauricular incision was outlined with a marking pen with extension around the ear lobule and around the mastoid down into the upper neck area. The incision was infiltrated with local anesthetic solution. A #15 scalpel was used to incise the skin. Electrocautery was then used to complete the subcuticular dissection. The greater auricular nerve branch to the external ear was identified and dissected and preserved. The anterior skin flap was developed in a superficial plane. The parotid was dissected off of the upper sternocleidomastoid muscle and the external auditory canal cartilage and then bone. The main trunk of the facial nerve was identified. A McCabe dissector was then used to dissected out towards the pes anserinus and to continue through the upper and lower divisions using the harmonic dissector to divide the parotid tissue. The tumor was very firm and large, approximately 4-5 cm in greatest dimension. The tumor was adjacent to the main trunk and the proximal portions of the upper and lower division and was basically dissected off of the nerve but the nerve was preserved. There was no gross tumor remaining. The entire tumor was removed with a cuff of parotid tissue around all of it except  for the deep part which was against the nerve. The nerve was kept intact. The wound was irrigated with saline. The drain was exited through separate stab incision and secured in place with nylon suture. The incision was reapproximated with running subcuticular 3-0 chromic and Dermabond was used on the skin. The drain was charged. The patient was awakened extubated and transferred to recovery in stable condition.    PATIENT DISPOSITION:  To PACU, stable

## 2014-12-26 NOTE — Anesthesia Postprocedure Evaluation (Signed)
Anesthesia Post Note  Patient: Tracy West  Procedure(s) Performed: Procedure(s) (LRB): LEFT PAROTIDECTOMY  (Left)  Anesthesia type: general  Patient location: PACU  Post pain: Pain level controlled  Post assessment: Patient's Cardiovascular Status Stable  Last Vitals:  Filed Vitals:   12/26/14 1145  BP: 123/83  Pulse: 55  Temp: 36.6 C  Resp: 19    Post vital signs: Reviewed and stable  Level of consciousness: sedated  Complications: No apparent anesthesia complications

## 2014-12-26 NOTE — Progress Notes (Signed)
Ms. Reaves arrived from OR to [facility] on a stretcher.  Attached to medical devices and oxygen.  Pt. Denies pain and trouble swallowing.  Dr. Constance Holster at bedside.

## 2014-12-27 DIAGNOSIS — D110 Benign neoplasm of parotid gland: Secondary | ICD-10-CM | POA: Diagnosis not present

## 2014-12-27 NOTE — Progress Notes (Signed)
Discharge instructions/prescription for phenergan and norco given and explained to pt.  Pt verbalizes understanding of all orders/instructions at this time and denies any questions.  IV removed.  Drain removed by MD and site CDI.  VSS. Pt discharged to home with friend with no s/s of distress. Tracy West

## 2014-12-27 NOTE — Discharge Instructions (Signed)
It is okay to shower and use soap and water. Do not use any creams, oils or ointment on the incision. Avoid heavy physical activity for 2 weeks.  Discharge Instructions: No heavy lifting x 1 week, keep incision dry for 48 hours post-op, skin glue will dissolve on its own, follow up with  Dr. Constance Holster At [facility] in 1  Week and Rx for phenergan and hydrocodone/acetaminophen on chart.

## 2014-12-27 NOTE — Discharge Summary (Signed)
12/27/2014  7:12 AM  Date of Admission: 12/26/2014 Date of Discharge: 12/27/2014  Discharge MD: Ruby Cola, MD  Admitting MD: Izora Gala, MD  Reason for admission/final discharge diagnosis: left parotid mass/parotidectomy  Labs: see EPIC  Procedure(s) performed: 12/26/14 left superficial parotidectomy  Discharge Condition: good  Discharge Exam: facial nerve House-Brackmann 1/6 bilaterally in all divisions, neck supple, drain removed POD#1  Discharge Instructions: No heavy lifting x 1 week, keep incision dry for 48 hours post-op, skin glue will dissolve on its own, follow up with  Dr. Constance Holster At [facility] in 1  Week and Rx for phenergan and hydrocodone/acetaminophen on chart.  Hospital Course: did well after left superficial parotidectomy, discharged on POD#1 after drain removed.  Ruby Cola 7:12 AM 12/27/2014

## 2014-12-29 ENCOUNTER — Encounter (HOSPITAL_COMMUNITY): Payer: Self-pay | Admitting: Otolaryngology

## 2017-08-08 ENCOUNTER — Ambulatory Visit
Payer: No Typology Code available for payment source | Attending: Internal Medicine | Admitting: Student in an Organized Health Care Education/Training Program

## 2017-08-08 VITALS — BP 110/62 | HR 62

## 2017-08-08 DIAGNOSIS — Z0000 Encounter for general adult medical examination without abnormal findings: Secondary | ICD-10-CM

## 2017-08-08 DIAGNOSIS — Z30019 Encounter for initial prescription of contraceptives, unspecified: Secondary | ICD-10-CM | POA: Insufficient documentation

## 2017-08-08 DIAGNOSIS — D229 Melanocytic nevi, unspecified: Secondary | ICD-10-CM | POA: Insufficient documentation

## 2017-08-08 NOTE — Patient Instructions (Addendum)
Thank you for visiting with me today. Here are the things I recommend from today's visit:    1) You were referred to [facility] procedure clinic for a biopsy of the mole on your back     2) You were referred to women's clinic for discussion about contraception (IUD)     3) To find a therapist for anxiety/depression we recommend the website psychology today     Here are a few tips to help navigate your healthcare needs:     Refills:  Call your pharmacy at least 4 working days before you run out. Do not call the clinic for refills, it's quicker and safer to go through your pharmacy.     Test Results: Available in 1-2 weeks. I will contact you by eCare or letter unless there is something urgent, in which case I will call you sooner.     Urgent Symptoms:  Call [phone], day or night, and select option 8. Our clinic staff will help you during regular hours; after hours, our on-call nurses will help you.     Other Questions: Use eCare to securely message me. Please note that e-care messages are only read during office hours. If you have a long or complex question or a new issue, please make an appointment.  Call [phone] to sign-up for eCare or ask your MA to sign you up today.

## 2017-08-08 NOTE — Progress Notes (Signed)
[facility] - Initial Visit    DATE: 08/08/2017    ID/CHIEF COMPLAINT: Melinda Odonnell is a 21 yo woman with no significant PMH who presents as a new patient for a wellness exam.     HISTORY OF PRESENT ILLNESS:  Jess reports she has no concerns except for a mole on her upper back she would like examined. She also reports a history of mild anxiety and depression. She is not interested in medication but would like to meet with a therapist. 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 She denies SI or drug use apart from marijuana.      PAST MEDICAL HISTORY:  Benign tumor behind ear 2017, surgically removed    Past Surgeries:   Wisdom teeth removal     MEDICATIONS:  No current outpatient prescriptions on file.    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     PHYSICAL EXAM:  Vitals:    08/08/17 1420   BP: 110/62   BP Cuff Size: Regular   BP Site: Right Arm   BP Position: Sitting   Pulse: 62   SpO2: 100%     Gen: Well-appearing, comfortable, pleasant, NAD  HEENT: Head atraumatic. Slera anicteric, no conjunctival injection. Oropharynx clear, mucus membranes moist. Thyroid smooth, symmetric without nodules. No cervical or supraclavicular lymphadenopathy.  CV: Normal S1 and S2. RRR. No m/r/g. No LE edema.  Resp: Normal respiratory effort. Lungs clear to auscultation bilaterally.   GI: Soft, non-tender, non-distended. Normal active bowel sounds.   Skin: Scattered acne on back. 4mm mole on mid upper back.   Neuro: A&Ox 3. Gait  normal.    ASSESSMENT & PLAN: Melinda Odonnell is a 21 yo woman with no significant PMH who presents as a new patient for a wellness exam.    1. Health care maintenance  - GC&CHLAM NUCLEIC ACID DETECTN  - HIV ANTIGEN AND ANTIBODY SCRN    2. Change in skin mole  - REFERRAL TO [facility] PROCEDURE CLINIC    3. Encounter for initial prescription of contraceptives, unspecified contraceptive  She agreed to gynecology referral for IUD placement vs discussion about other contraceptive options. She is busy with finals at this time but will try to schedule the appointment for a future date.    - REFERRAL TO [place]     Vaccinations:  - Influenza - declined today   - TdAP: reported per pt in 2012  - HPV - she believes she has had one vaccine for this in the past but not the complete series, she will obtain the records and let me know via [place]:  Consider diabetes and cholesterol screen at age 52, no risk factors  BMI: normal   BP: well controlled   Diet/Exercise: counseled on a diet full in vegetables and on 30 min per day of exercise     Substances:  -OH:  4 drinks per week  -Tobacco: none  -Drugs: marijuana    Cancer screening:  -Melanoma/BCC/SCC: Sunscreen counseling for spf >30  -Colon cancer: No FH, due at age 89.   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

## 2017-08-08 NOTE — Progress Notes (Signed)
I saw and evaluated the patient. I have reviewed the resident's documentation and agree with it.

## 2017-08-09 LAB — GC&CHLAM NUCLEIC ACID DETECTN
Chlam Trachomatis Nucleic Acid: NEGATIVE
N.Gonorrhoeae(GC) Nucleic Acid: NEGATIVE

## 2017-08-09 LAB — HIV ANTIGEN AND ANTIBODY SCRN
HIV Antigen and Antibody Interpretation: NONREACTIVE
HIV Antigen and Antibody Result: NONREACTIVE

## 2017-08-10 ENCOUNTER — Telehealth (HOSPITAL_BASED_OUTPATIENT_CLINIC_OR_DEPARTMENT_OTHER): Payer: Self-pay | Admitting: Student in an Organized Health Care Education/Training Program

## 2017-08-10 ENCOUNTER — Encounter (HOSPITAL_BASED_OUTPATIENT_CLINIC_OR_DEPARTMENT_OTHER): Payer: Self-pay | Admitting: Student in an Organized Health Care Education/Training Program

## 2017-08-10 DIAGNOSIS — Z309 Encounter for contraceptive management, unspecified: Secondary | ICD-10-CM

## 2017-08-10 NOTE — Telephone Encounter (Signed)
(  TEXTING IS AN OPTION FOR [facility] CLINICS ONLY)  Is this a [place] clinic? No      RETURN CALL: Detailed message on voicemail only      SUBJECT:  Appointment Request     REASON FOR REQUEST/SYMPTOMS: Biopsy  REFERRING PROVIDER: Karna Christmas, MD  REQUEST APPOINTMENT WITH: NA  REQUESTED DATE: Call, TIME: Call  UNABLE TO APPOINT BECAUSE: [facility] cannot schedule procedures

## 2017-08-11 NOTE — Telephone Encounter (Signed)
Please see eCare message.   Was there a reason for the referral being placed to [facility]?    Routing to Dr. Johny Blamer

## 2017-08-11 NOTE — Telephone Encounter (Signed)
Unsure where this was intended to be scheduled (procedure or with you).      Routing to Dr. Johny Blamer

## 2017-08-12 NOTE — Telephone Encounter (Signed)
She needs to be scheduled with Dr. Shelby [facility] procedure clinic for a shave biopsy. Thank you!

## 2017-08-14 NOTE — Telephone Encounter (Signed)
Referral in chart for Procedure clinic with Dr. Rayburn Go.     LVM for call back to help schedule.

## 2017-08-14 NOTE — Telephone Encounter (Signed)
Patient returned call to schedule for Dr. [facility].    Will route to PSS pool as CCR or I are unable to schedule these.

## 2017-08-16 ENCOUNTER — Other Ambulatory Visit: Payer: Self-pay

## 2017-08-23 ENCOUNTER — Encounter (HOSPITAL_BASED_OUTPATIENT_CLINIC_OR_DEPARTMENT_OTHER): Payer: Self-pay | Admitting: Obstetrics/Gynecology

## 2017-08-23 NOTE — Progress Notes (Signed)
[name]  NEW GYNECOLOGY VISIT    ID/CC: 21 year old G22 female presents for a new gynecology visit for contraceptive counseling    HPI: Melinda Odonnell presents today to discuss contraception. She is currently sexually active, using condoms consistently. She would like a more reliable form of birth control and is interested in an IUD. She has used COCs in the past, but reports she had significant mood disturbance and would like to avoid hormones. She has regular periods that are heavy at times and does reports bad cramping. Occasionally takes ibuprofen for pain, but not consistently.       GYN history:   Menarche: 8  Menses: regular every 25 days, heavy flow (4-5 tampons per day), lasts 5 days, with moderate pain  STI: chlamydia 2018  Pap smear 02/2016 in [place], normal per patient  Contraception: condoms, COCs  Sexually active with two female partners     Medical history:  None    Surgical history:   None    OB History     None              Current Outpatient Prescriptions   Medication Sig Dispense Refill    Cholecalciferol (VITAMIN D OR)       Copper (ParaGard) Intrauterine Device Insert 1 Intra Uterine Device into the uterus.       Current Facility-Administered Medications   Medication Dose Route Frequency Provider Last Rate Last Dose    ibuprofen 800 mg tablet  800 mg Oral Once Malaiah Viramontes, Eldridge Dace, DO           Review of patient's allergies indicates:  Allergies   Allergen Reactions    Doxycycline Swelling       There are no active problems to display for this patient.          Social History   Substance Use Topics    Smoking status: Never Smoker    Smokeless tobacco: Not on file    Alcohol use Not on file     Grad student in mathematics at [employer]  6 drinks per week  No tobacco  Occasional marijuana     Immunization History   Administered Date(s) Administered    Tdap vaccine 08/08/2010       ROS:  Extended 2-9, negative except that noted in above HPI      Physical Exam:  1995    Detailed - 5-7 systems, Comprehensive -8+  BP 128/85    Pulse 83    Ht 5\' 3"  (1.6 m)    Wt 115 lb 6.4 oz (52.3 kg)    LMP 08/05/2017 (Exact Date)    BMI 20.44 kg/m   General: healthy, alert, no distress.  Cardiac:  No edema or varicosities.  Respiratory: Normal respiratory effort and chest wall movement with respiration.   Abdomen: Soft, non-tender. No masses or organomegaly.   Psychiatric:   Mood/affect:  Normal.  Orientation: oriented to time, person and place  Neurologic:  Gait:  Normal.  Skin: 1.5 cm light brown plaque right inguinal fold with regular borders.   Pelvic Exam: External genitalia normal, normal bartholin/skene/urethral meatus/anus., Vagina is rugated and well-estrogenized, cervix normal in appearance, no CMT, no bladder tenderness, uterus retroverted, normal size, shape, and consistency, no adnexal masses or tenderness.     Impression: 21 year old G75 female presents for   Chief Complaint   Patient presents with    Contraception     discuss birth control options     #  Contraceptive counseling- Reviewed various contraceptive options including pill, patch, ring, injection and LARC. Patient most interested in IUD which we reviewed risks, benefits, and expected side effect for LNG-IUD vs Paragard. Patient would like to proceed with Paragard placement today. See procedure note below.     # Healthcare maintenance  -Pap smear due 02/2019  -Review of record indicates she has never had HPV vaccine. Left message regarding recommendation and that she can return to have done if she elects.     Melinda Odonnell was seen today for contraception.    Diagnoses and all orders for this visit:    Encounter for initial prescription of intrauterine contraceptive device (IUD)  -     INSERTION INTRAUTERINE DEVICE IUD  -     INTRAUTERINE COPPER CONTRACEPTIVE  -     ibuprofen 800 mg tablet; Take 1 tablet (800 mg) by mouth One time.      Melinda Odonnell is a 21 year old female who presents today for IUD insertion.    A visit for  counseling and discussion of this procedure took place today. Cervical assay for chlamydia and gonorrhea was done 08/08/17 and result was negative. Pap smear was not done. A urine pregnancy test was not done. Patient's last menstrual period was 08/05/2017 (exact date). She has had consistent condom use with exception of unprotected intercourse 5 days ago. Discussed possibility of early conception during this time and deferring placement vs indication for Paragard as emergency contraception. Patient would like to proceed with IUD placement.     PROCEDURE NOTE:  INTRAUTERINE DEVICE INSERTION  ParaGard T380A IUD  Lot number: [id number]  [place] number: [phone]    Indication:  Desire for contraception  Allergic to anesthetic, latex, iodine: NO    Review of contraindications:    Known or suspected pregnancy: NO    Distorted uterine cavity (cannot accommodate an IUD): NO    Current breast cancer (Levonorgestrel IUD only): NO     Cervical cancer, awaiting treatment: NO    Endometrial cancer: NO    Current pelvic infection: NO    Unexplained abnormal uterine bleeding: NO    History of Wilson's disease (Copper IUD only): NO   In compliance with federal regulations, the patient was given the informational brochure that accompanies the IUD: YES  The risks and benefits of use of the IUD were discussed with the patient: YES    Risks include, but are not limited to:   1. The remote chance of contraceptive failure   2. Increased risk of ectopic pregnancy, infection, and/or miscarriage if pregnancy occurs   3. Risk of PID if exposed to sexually transmitted diseases   4. Risk of heavier periods and dysmenorrhea (Copper IUD only)    5. Risk of irregular periods and amenorrhea (Levonorgestrel IUD only)    6. Risk of embedment of the IUD into the endometrium and consequent difficulty in removal   7. Risk of uterine perforation, possible damage to intraabdominal organs and/or need for surgical intervention.       Correct patient NAME and  ID YES   Correct PROCEDURE YES   Correct EQUIPMENT and SETTINGS YES   Completed CONSENT YES, Date: 08/24/2017         The patient was placed in the dorsal lithotomy position.  Bimanual exam showed the uterus to be in the retroverted position.  A speculum was inserted into the vagina.  The cervix was prepped with povidone iodine and a single tooth tenaculum was applied to the  anterior lip of the cervix.  A sterile uterine sound was used to sound the uterus to a depth of 7 cm.  A ParaGard T380A IUD intrauterine device was inserted to the fundus using the prepackaged inserter.  The IUD strings were trimmed to a length of 3 cm from the cervical os.    The patient tolerated the procedure well.  Complications: none  Procedure performed by Resident physician with Attending present    Postprocedure education was performed, including the following points:  1. You can manually check your IUD strings and were taught how to do this, but this is not required.   2. If you are concerned you are pregnant, call your doctor.   3. Seek immediate care for signs of infection, including pelvic pain, vaginal discharge or excessive or intermenstrual bleeding, and fever.   4. If you have heavy cramping and are concerned that your IUD has been expelled, please call your provider.   5. The PARAGARD T380A IUD should be removed 10-12 years after insertion (past 10 years discussed with patient that it was off-label use).  6. It is still necessary to return for periodic check-ups, pap smears, etc at a time interval recommended by your provider.    Patient instructed to return for follow-up prn.

## 2017-08-24 ENCOUNTER — Ambulatory Visit (HOSPITAL_BASED_OUTPATIENT_CLINIC_OR_DEPARTMENT_OTHER): Payer: No Typology Code available for payment source | Admitting: Obstetrics & Gynecology

## 2017-08-24 ENCOUNTER — Encounter (HOSPITAL_BASED_OUTPATIENT_CLINIC_OR_DEPARTMENT_OTHER): Payer: Self-pay | Admitting: Obstetrics & Gynecology

## 2017-08-24 ENCOUNTER — Encounter (HOSPITAL_BASED_OUTPATIENT_CLINIC_OR_DEPARTMENT_OTHER): Payer: Self-pay

## 2017-08-24 ENCOUNTER — Ambulatory Visit: Payer: No Typology Code available for payment source | Attending: Obstetrics/Gynecology | Admitting: Internal Medicine

## 2017-08-24 VITALS — BP 110/64 | Wt 112.3 lb

## 2017-08-24 VITALS — BP 128/85 | HR 83 | Ht 63.0 in | Wt 115.4 lb

## 2017-08-24 DIAGNOSIS — D485 Neoplasm of uncertain behavior of skin: Secondary | ICD-10-CM | POA: Insufficient documentation

## 2017-08-24 DIAGNOSIS — Z30014 Encounter for initial prescription of intrauterine contraceptive device: Secondary | ICD-10-CM

## 2017-08-24 DIAGNOSIS — D225 Melanocytic nevi of trunk: Secondary | ICD-10-CM | POA: Insufficient documentation

## 2017-08-24 DIAGNOSIS — Z6820 Body mass index (BMI) 20.0-20.9, adult: Secondary | ICD-10-CM

## 2017-08-24 DIAGNOSIS — D229 Melanocytic nevi, unspecified: Secondary | ICD-10-CM

## 2017-08-24 MED ORDER — IBUPROFEN 800 MG OR TABS
800.0000 mg | ORAL_TABLET | Freq: Once | ORAL | Status: AC
Start: 2017-08-24 — End: 2017-08-24
  Administered 2017-08-24: 800 mg via ORAL

## 2017-08-24 NOTE — Patient Instructions (Signed)
Patient Education     Birth Control: IUD (Intrauterine Device)    The IUD (intrauterine device) is small, flexible, and T-shaped. A trained healthcare provider places it in the uterus. The IUD is one of the most effective birth control methods. It is also reversible. This means it can be removed at any time by a trained healthcare provider. New IUDs are safe and do not have the risks of older types of IUDs.  Pregnancy rates  Talk to your healthcare provider about the effectiveness of this birth control method.  Types of IUDs  IUD insertion is done in the healthcare providers office. Two types of IUDs are available:   The copper IUD releases a small amount of copper into the uterus. The copper makes it harder for sperm to reach the egg. The device works for at least 10 years.   The progestin IUD releases a hormone called progestin. It causes changes in the uterus to help prevent pregnancy. The device works for 3 to 5 years, depending on which device is chosen. It may be recommended for women who have anemia or heavy and painful periods.  IUDs have thin strings that hang from the opening of the uterus into the vagina. This lets youcheck that the IUD stays in place.  Things to know about IUDs   IUDs can be used by women who have never been pregnant or by women with a history of sexually transmitted infections (STIs) or tubal pregnancy.   It won't move from the uterus to any other part of the body.   There is a slight risk of the device coming out of the vagina (expulsion).   It may not work in women who have an abnormally shaped uterus.   A copper IUD may cause heavier periods and cramping.   Progestin IUD may cause light periods or no periods at all (irregular bleeding or spotting is possible and normal during first 3 to 6 months).   If you get a sexually transmitted infection with an IUD in place, symptoms may be more severe.  What to report to your healthcare provider  Be sure your healthcare provider  knows if you have:   Asexually transmittedinfection (STI)or possible STI   Liver problems   Blood clots (for progestin IUD only)   Breast cancer or a history of breast cancer (progestin IUD only)   Date Last Reviewed: 11/11/2015   2000-2017 [employer]. [address] [place] [address]. All rights reserved. This information is not intended as a substitute for professional medical care. Always follow your healthcare professional's instructions.

## 2017-08-24 NOTE — Progress Notes (Signed)
Punch Biopsy    Indication: Neoplasm of uncertain behavior  Site(s): 4 mm x 3 mm brown, asymmetric macule mid back at T3 level. See photo. Diff dx:  atypical mole vs. melanoma. No personal or family hx of melanoma.     Prep: Chlorhexadine  Anesthesia: 1% lidocaine with epi    Description of procedure:    The risk of scarring, bleeding, infection, pain, nerve damage and the benefit of obtaining a potential diagnosis were explained to the patient. The patient agreed to the procedure after being informed of the risks and benefits.    Final verification was performed.  After prepping the skin and with anesthesia, a 77mm punch biopsy instrument was used to obtain the specimen. Specimen observed to be in the container correctly labeled with the patient's name and sent to the lab. Hemostasis was obtained using non-absorbing suture. Sterile dressing applied over white petrolatum.    The patient tolerated the procedure well.  . Patient was given wound care instructions.      Patient was instructed to remove sutures in 78-93 days, if applicable.

## 2017-08-27 NOTE — Progress Notes (Signed)
I saw and evaluated the patient. I have reviewed the resident's documentation and agree with it.  I was present for the entire procedure.

## 2017-08-28 LAB — PATHOLOGY, SURGICAL

## 2017-08-28 NOTE — Addendum Note (Signed)
Addended by: Tomasa Blase on: 08/28/2017 04:33 PM     Modules accepted: Orders

## 2017-08-28 NOTE — Progress Notes (Signed)
The biopsy came back and it demonstrated a mole.  It did have some moderate atypia to it however and as we discussed I would like you to see dermatology to see if any further excision is necessary.  I've placed a referral to dermatology.  They should be contacting you.  Let me know if you do not hear from anyone.  Please let me know that you are aware of this plan.

## 2017-09-08 ENCOUNTER — Ambulatory Visit: Payer: No Typology Code available for payment source | Attending: "Endocrinology

## 2017-09-08 DIAGNOSIS — Z4802 Encounter for removal of sutures: Secondary | ICD-10-CM | POA: Insufficient documentation

## 2017-09-08 NOTE — Progress Notes (Signed)
Sutures (X 2) removed from mid back (previous punch biopsy per Dr.O'Connor),  Suture line first cleansed with a small amount of H2O2, no drainage, sutures easily removed.  Some redness on either side wound area reddened (appears to be irritated tissue from band aids)    Patient can shower, regular soap and water.  Watch for s/s of infection, return to clinic if symptoms occur.    Thank You

## 2017-11-13 NOTE — Progress Notes (Signed)
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 She recently had a biopsy of the right intrascapular back for an atypical nevus. She has another lesion on the back that is concerned about. She denies biopsy in the past of other nevi. Father has a history of BCC.    She has a lesion in the pelvic region that her gynecologist suggested her to see a dermatologist for. She notes a coarse dry spot for about 5 years. She notes some itchy but no other symptoms.    She has acne on the face and back. She has been using a generic face wash with benzoyl peroxide. Her acne fluctuates with her menstrual cycle. She tried OCPs in the past but notes that made her hormones crazy.    REVIEW OF SYSTEMS:  See scanned patient intake form for a complete ROS and other supplemental history which was reviewed by me today.     SOCIAL HISTORY:  Lives in [place] with friends. [employer] grad school applied math. Sexually active with 2 partners, uses condoms. From [place], moved in 2017. History of excessive sun exposure with blistering sunburns. Occasional  exercise, yoga. 4 drinks per week. Uses marijuana. She reports that she has never smoked. She does not have any smokeless tobacco history on file.     FAMILY HISTORY:  Parents healthy   Grandparents: HTN and heart disease    ALLERGIES: Doxycycline (caused facial swelling)    PMHx, ALLERGIES, and MEDICATIONS:  Reviewed in EPIC today by me.    PHYSICAL EXAMINATION:  Vital Signs: There were no vitals taken for this visit.  General: She is a well appearing adult female in no acute distress.  Normal development, and nutrition.  Normal affect and mood, without barriers to understanding.  Skin: Exam performed included scalp, hair, face, eyelids, lips, neck, chest, abdomen, back, bilateral arms, hands, fingernails, bilateral legs, feet, toenails, groin, genital skin, and buttocks.  Exam was negative except for:    upper back to mid back - inflammatory papules, no pustules  intrascapular back right of midline - well healed depressed scar without pigmentation  overlying medial aspect of right scapula - 4.80mm fleshy exophytic papule, medium brown on the top and skin colored otherwise c/w compound predominantly intradermal nevus  few inflammatory papules - forehead, chin  open comedones - nose  right inguinal fold near thigh- well demarcated, subtle, slightly darker than skin tone patch  numerous discrete light and  medium brown macules uniformly-pigmented symmetrical in shape with smooth regular borders c/w benign melanocytic nevi - lower extremities, upper extremities     KOH (right inguinal fold): positive for yeast    Previous Report(s) Reviewed: historical medical records and referral letter/letters    IMPRESSION/PLAN:  The following was discussed with the patient.  Recommendations are as follows:    1. 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  - None appear concerning for biopsy or treatment at this time. Advised to  monitor for ABCDEs of melanoma.  - Recommended self skin examinations every 4-6 weeks.   - Reviewed recommended routine sun protective measures including sun avoidance, long sleeves/pants, hats, and sunscreen use.     2. Atypical nevus (right intrascapular back)  No atypical nevi identified on exam are any more atypical than the rest. Fortunately no atypical nevi meet biopsy threshold. Reviewed ABCDE's of melanoma with the patient, AVS provided today.  Recommend self-skin examinations every 4-6 weeks.  Recommend routine sun protective measures including avoidance, long sleeves/pants, UPF clothing, sunglasses, hats, and sunscreen use.    Given the moderate atypia and margins free on biopsy, no additional treatment is necessary.  Had them margins been positive or there been persistent pigmentation, reexcision would've been suggested.  - No evidence of recurrence on physical exam today of previously biopsied nevus on the right intrascapular back.    3. Pityriasis versicolor (right inguinal fold)  This is an overgrowth of a natural yeast present on our skin.  It an appear as small spots that are lighter or darker than skin tone.  It typically involves the neck, trunk, and arms, however it can appear anywhere on the body.  It can range in color from white, pink, salmon, red, tan, or brown.  The spots can grow together and form patches.  It can be more noticeable after significant sun exposure as the yeast prevents the skin from tanning.  It can be dry, scaly, and itchy.  Pain is rarely associated.  Treatments include topical and oral antifungals as well as medicated cleansers.    - She will start an OTC antifungal cream like clotrimazole to apply to the right inguinal fold for BID for 2-4 weeks then as needed for recurrence. The color change may resolve after the tinea has resolved.    4. Screening for malignant neoplasm of skin  - No concerning lesions on exam. Recommended regular skin exams and sun protection. Observe  closely for skin damage/changes, and call if such occurs.  - AVS provided for safe sun practices.     5. Acne  Patient has inflammatory papules of the face and back consistent with acne. She notes that past use of OCPs made her hormones crazy. Since her acne fluctuates with her menstrual cycle, I recommended her to use spironolactone. Risks and benefits associated with usage were reviewed today. She will let me know if she would like to start this medication. She will continue to use OTC benzoyl peroxide wash to the face and back.     Return to clinic in 1 year or sooner PRN acute concerns.    I, Rivka Spring, Medical Scribe, acted as a Education administrator and documented the service/procedure performed to the best of my knowledge in the presence of Lily Lovings, MD who will provide the final review and authentication.  Signed: Rivka Spring, Medical Scribe  Date: 11/16/2017   Time:  10:01 AM

## 2017-11-16 ENCOUNTER — Encounter (HOSPITAL_BASED_OUTPATIENT_CLINIC_OR_DEPARTMENT_OTHER): Payer: Self-pay | Admitting: Dermatology

## 2017-11-16 ENCOUNTER — Ambulatory Visit: Payer: No Typology Code available for payment source | Attending: Dermatology | Admitting: Dermatology

## 2017-11-16 DIAGNOSIS — L700 Acne vulgaris: Secondary | ICD-10-CM

## 2017-11-16 DIAGNOSIS — Z1283 Encounter for screening for malignant neoplasm of skin: Secondary | ICD-10-CM | POA: Insufficient documentation

## 2017-11-16 DIAGNOSIS — L708 Other acne: Secondary | ICD-10-CM | POA: Insufficient documentation

## 2017-11-16 DIAGNOSIS — D229 Melanocytic nevi, unspecified: Secondary | ICD-10-CM | POA: Insufficient documentation

## 2017-11-16 DIAGNOSIS — B360 Pityriasis versicolor: Secondary | ICD-10-CM | POA: Insufficient documentation

## 2017-11-16 NOTE — Progress Notes (Signed)
I, Danuta Huseman L Katheryn Culliton, MD, personally performed the services described in this documentation, as scribed by the medical scribe in my presence, and it is both accurate and complete.

## 2017-11-16 NOTE — Progress Notes (Signed)
Chief Complaint FSE      Do you have a history of skin cancer? NO    Personal history of melanoma? NO    Immediate family history of melanoma? NO    Would you like a full skin exam today? YES    Gown: YES      If our office needs to contact you after your visit today, is it ok to leave a detailed message on your phone or e-care ?  YES      What is the preferred method of contact e-care/ phone  number?   Telephone Information:   Home Phone [phone]

## 2017-11-16 NOTE — Patient Instructions (Signed)
HOW TO DO A SELF SKIN EXAMINATION:  (adapted from: http://www.skin cancer.org/skin-cancer-information/early-detection/step-by-step-self-examination)    1. Examine your face, especially the nose, lips, mouth, and ears (both sides of your ears). Use one or both mirrors to get a clear view.  2. Thoroughly inspect your scalp, using a blow dryer and mirror to expose each section to view. Get a friend or family member to help, if you can.  3. Check your hands carefully: palms and backs, between the fingers and under the fingernails. Continue up the wrists to examine both front and back of your forearms.  4. Standing in front of the full-length mirror, begin at the elbows and scan all sides of your upper arms. Don't forget the underarms.  5. Next focus on the neck, chest, and torso. Women should lift breasts to view the underside.  6. With your back to the full-length mirror, use the hand mirror to inspect the back of your neck, shoulders, upper back, and any part of the back of your upper arms you could not view in step 4.  7. Still using both mirrors, scan your lower back, buttocks, and backs of both legs.  8. Sit down; prop each leg in turn on the other stool or chair. Use the hand mirror to examine the genitals. Check front and sides of both legs, thigh to shin, ankles, tops of feet, between toes and under toenails. Examine soles of feet and heels.    Please be on the look out for any suspicious lesions.  These include growths or moles that are growing, changing or looking different than other growths or moles on the body. Also pay attention to any growths that itch, bleed, or don't heal. Consider taking digital photographs of any mole or growth that you want to monitor over time.  Call your provider if you have any questions or find a concerning growth or mole.    More information about skin cancer and what to look for can be found at:   https://www.aad.org/public/spot-skin-cancer        CHANGES IN MOLES:  See your  health care provider if your moles hurt, itch, ooze, bleed, thicken, or become crusty. Call your health care provider if your moles show signs of melanoma. These include a mole that has:    · Asymmetry. The sides of the mole don’t match  · Border. The edges are ragged, notched, or blurred  · Color. The color within the mole varies  · Diameter. The mole is larger than 6 mm (size of a pencil eraser)  · Evolving. The mole is getting larger or the shape or color of the mole is changing     Adapted from:  © 2000-2015 [employer] [address], [place] [address]. All rights reserved. This information is not intended as a substitute for professional medical care. Always follow your healthcare professional's instructions.          PREVENTING SKIN CANCER:  Relaxing in the sun may feel good. But it isn’t good for your skin. In fact, being exposed to the sun’s harmful rays is a major cause of skin cancer.  People of all ages and backgrounds are at risk.     Your role in prevention  You can act today to help prevent skin cancer. Start by avoiding the sun’s UV (ultraviolet) rays. And don’t use tanning beds, which are no safer than the sun. Taking these steps can help keep you from getting skin cancer. It can also help   prevent wrinkles and other sun-induced aging effects. Make sure your children also follow these safeguards. Now is the time to start taking preventive steps against skin cancer.    When you are outdoors  Protect your skin when you go outdoors during the day.   · Wear tightly woven clothing that covers your skin. Put on a wide-brimmed hat to protect your face, ears, and scalp.  · Watch the clock. Try to avoid the sun between 10 a.m. and 4 p.m., when it is strongest.  · Head for the shade or create your own. Use an umbrella when sitting or strolling.  · Know that the sun’s rays can reflect off sand, water, and snow. This can harm your skin. Take extra care when you are near reflective  surfaces.  · Keep in mind that even when the weather is hazy or cloudy, your skin can be exposed to strong UV rays.  · Shield your skin with sunscreen. Also, apply sunscreen to your children’s skin.      Tips for using sunscreen  To help prevent skin cancer, choose the right sunscreen and use it correctly. Try the following tips:  · Choose a sunscreen that has a sun protection factor (SPF) of at least 30. Also, choose a sunscreen labeled “broad spectrum.” This will shield you from both UVA and UVB (ultraviolet A and B) rays.  · If one brand irritates your skin, try another  · Use a water-resistant sunscreen if swimming or sweating.  · Use at least an ounce of sunscreen (enough to fill a shot glass) to cover exposed areas. You might need to adjust the amount depending on your body size.  · Apply the sunscreen to dry skin about 15  minutes before going outdoors to give it time to be absorbed.  · Reapply sunscreen every 2 hours. If you’re active or in the water, do this more often.  · Cover any sun-exposed skin, from your face to your feet. Don’t forget your ears and your lips.  · Know that while sunscreen helps protect you, it isn’t enough. Sunscreens extend the length of time you can be outdoors before your skin begins to redden, but they don't give you total protection. Using sunscreen doesn't mean you can stay out in the sun indefinitely, since damage to the skin cells is still occurring. You should also wear protective clothing. And try to stay out of the sun as much as you can, especially from 10 a.m. to 4 p.m.    Adapted from:  © 2000-2015 [employer]. [address], [place] [address]. All rights reserved. This information is not intended as a substitute for professional medical care. Always follow your healthcare professional's instructions.    Web Sites to Learn More  • Skin Cancer Foundation:  www.skincancer.org/Sunscreens-Explained.html  • American Academy of  Dermatology:  Www.aad.org/public/publications/pamphlets/sun_sunscreens.html        SUNSCREEN RECOMMENDATIONS:  Rated 90 and above Consumer Reports 2018    Scented lotions:  - Equate ([employer]) Sport Lotion SPF50 (low price)  - BullFrog Land Sport Quik Gel SPF50  - Coppertone Water Babies SPF 50 (fragrance free, oil free)  - Equate ([employer]) Ultra Protection Lotion SPF50 (low price)    Unscented lotion:  - La Roche-Posay Anthelios 60 melt in sunscreen milk lotion (does not leave an oily sheen to skin)  - Coppertone UltraGuard SPF70 lotion (low odor or residue)    Sprays: (we recommend lotions instead of sprays)  - Trader Joe's Spray SPF 50+ (  low price) (has a floral and citrus scent)  - Banana Boat SunComfort Continuous Spray SPF 50+ (has a slight coconut scent)  - [employer] Beach Guard Clear Spray SPF 70    Sticks: (none of the sticks were rated at 90 or above)  - Up & Up ([employer]) Kids Sunscreen Stick SPF 55 (Target, low price)  _______________________________________    Consumer Reports also had these additional category recommendations in 2016:    Facial lotions:  - Avon Sun+ sunscreen face lotion SPF40  - [employer] Up & Up Ultra Sheer SPF30 (also is fragrance free)    Oxybenzone Free:  - Ocean Potion Protect & Nourish SPF 30 (<$10 for 8 oz)     Zinc Oxide/Titanium Dioxide only:  - Cotz plus SPF 58 (<$25 for 2.5 oz)  - [place] Baby Super Sensitive SPF30+ (~$15 for 2.9 oz)

## 2017-11-23 MED ORDER — SPIRONOLACTONE 50 MG OR TABS
ORAL_TABLET | ORAL | 3 refills | Status: AC
Start: 2017-11-23 — End: ?

## 2018-01-11 NOTE — Progress Notes (Signed)
[place] VISIT    DATE:  01/11/2018    ID/CC: Melinda Odonnell is a 22 year old woman who presents for left arm paresthesias    INTERVAL HISTORY:  Jess reports she has had left arm paresthesias for the last 2-3 weeks. The numbness extends from the top of her shoulder to her hand. It is intermittent and worse in the morning. She currently does not have the numbness and does not recall where exactly it is located on her arm (radial vs ulnar etc). She denies any obvious weakness in that arm. 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 I do not have access to these records. She denies any trauma to the site or inciting factors. She has tried [employer] which has not helped.     INTERVAL SOCIAL HISTORY:  [employer] grad student     Medications    Current Outpatient Medications:     Cholecalciferol (VITAMIN D OR), , Disp: , Rfl:     Copper (ParaGard) Intrauterine Device, Insert 1 Intra Uterine Device into the uterus., Disp: , Rfl:     Spironolactone 50 MG Oral Tab, Take 1 tablet daily for 2 weeks then increase to 2 tablets daily, Disp: 180 tablet, Rfl: 3    PHYSICAL EXAM:  Vitals:    01/12/18 1038   BP: 110/72   BP Cuff Size: Regular   BP Site: Right Arm   BP Position: Sitting   Pulse: 70   Weight: 116 lb 8 oz (52.8 kg)     Gen: Well-appearing, comfortable, pleasant, NAD  HEENT: Head atraumatic. Slera anicteric, no conjunctival injection.   CV: Normal S1 and S2. RRR. No m/r/g. No LE edema.  Resp: Normal respiratory effort.   GI: Soft, non-tender, non-distended.   MSK: Left Shoulder/upper back: No asymmetry, scars, erythema, masses, muscle wasting or scapular winging noted. No pain on palpation of AC joint, biceps tendon, or cervical spine. Full active and passive ROM in L shoulder. Neer, Hawkins, and empty can tests negative. 5/5 strength in bl shoulders/forearms/hands. Temperature/pinprick similar in both arms, no obvious  dermatomal distribution of numbness.   Skin: No rashes or bruising  Neuro: A&Ox 3. Gait normal.    ASSESSMENT/PLAN: Melinda Odonnell is a 22 year old woman who presents for left arm paresthesias    1. Left arm numbness  Unclear etiology. May be due to cervical radiculopathy though no clear weakness or dermatomal distribution of numbness. Physical exam does not reveal rotator cuff pathology. 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 Will evaluate for cervical radiculopathy by MRI, if symptoms do not improve would suggest EMG.   - MRI C SPINE W/O CONTRAST; Future  - REFERRAL TO MASSAGE THERAPY    I discussed this patient with Dr Izetta Dakin for a problem focused visit     Johnston Ebbs, MD  Internal Medicine, PGY1

## 2018-01-12 ENCOUNTER — Ambulatory Visit
Payer: No Typology Code available for payment source | Attending: Student in an Organized Health Care Education/Training Program | Admitting: Student in an Organized Health Care Education/Training Program

## 2018-01-12 VITALS — BP 110/72 | HR 70 | Wt 116.5 lb

## 2018-01-12 DIAGNOSIS — Z6820 Body mass index (BMI) 20.0-20.9, adult: Secondary | ICD-10-CM

## 2018-01-12 DIAGNOSIS — R200 Anesthesia of skin: Secondary | ICD-10-CM | POA: Insufficient documentation

## 2018-01-12 NOTE — Progress Notes (Signed)
I have personally discussed the case with the resident during or immediately after the patient visit including review of history, physical exam, diagnosis, and treatment plan. I agree with the assessment and plan of care.

## 2018-01-12 NOTE — Progress Notes (Signed)
-------------------------------------------    Attending: Shay Jhaveri L Adream Parzych, MD  I have personally discussed the case with Dr. BAUM, REBECCA ANN during or immediately after the patient visit including review of history, physical exam, diagnosis, and treatment plan. I agree with the assessment and plan of care.   -------------------------------------------

## 2018-01-12 NOTE — Patient Instructions (Signed)
Thank you for visiting with me today. Here are the things I recommend from today's visit:    You were seen for left arm numbness. I ordered an MRI and will follow up with you on these results. You were also referred to massage therapy. Let me know if the numbness does not improve in the next few weeks.     Here are a few tips to help navigate your healthcare needs:     Refills:  Call your pharmacy at least 4 working days before you run out. Do not call the clinic for refills, it's quicker and safer to go through your pharmacy.     Test Results: Available in 1-2 weeks. I will contact you by eCare or letter unless there is something urgent, in which case I will call you sooner.     Urgent Symptoms:  Call [phone], day or night, and select option 2. Our clinic staff will help you during regular hours; after hours, our on-call nurses will help you.     Other Questions: Use eCare to securely message me. Please note that e-care messages are only read during office hours. If you have a long or complex question or a new issue, please make an appointment.  Call [phone] to sign-up for eCare or ask your MA to sign you up today.

## 2018-01-19 ENCOUNTER — Ambulatory Visit: Payer: No Typology Code available for payment source | Attending: Neuroradiology

## 2018-01-19 DIAGNOSIS — R200 Anesthesia of skin: Secondary | ICD-10-CM

## 2018-01-19 DIAGNOSIS — M2578 Osteophyte, vertebrae: Secondary | ICD-10-CM | POA: Insufficient documentation

## 2018-04-16 ENCOUNTER — Ambulatory Visit
Payer: No Typology Code available for payment source | Attending: Internal Medicine | Admitting: Student in an Organized Health Care Education/Training Program

## 2018-04-16 VITALS — BP 110/58 | HR 62 | Ht 62.0 in | Wt 114.3 lb

## 2018-04-16 DIAGNOSIS — R140 Abdominal distension (gaseous): Secondary | ICD-10-CM | POA: Insufficient documentation

## 2018-04-16 NOTE — Progress Notes (Signed)
[place] VISIT    DATE:  04/16/2018    ID/CC: Melinda Odonnell is a 22 year old woman with no significant PMH who presents with concerns for flatulence/bloating after meals.     INTERVAL HISTORY:    #Bloating/Flatulence  Melinda Odonnell reports for the last 6 months she has noticed bloating/flatulence associated with certain foods such as pasta and beer. She says she can't even finish a beer because she feels so full afterwards. Recently she ate pasta and noticed her chest/arms turned red for 45 min - denies hives, throat/lip swelling/trouble breathing. She has no history of food allergies prior to this episode. She reports constipation associated with these foods and mild heartburn. She denies diarrhea, bloody stool, weight loss, floating stools or current rash. She denies FH of celiac disease or T1DM. She eats ice cream and cheese without any gas/bloating.     INTERVAL SOCIAL HISTORY:  Current teaching a class at [employer]    Medications    Current Outpatient Medications:     Cholecalciferol (VITAMIN D OR), , Disp: , Rfl:     Copper (ParaGard) Intrauterine Device, Insert 1 Intra Uterine Device into the uterus., Disp: , Rfl:     Spironolactone 50 MG Oral Tab, Take 1 tablet daily for 2 weeks then increase to 2 tablets daily, Disp: 180 tablet, Rfl: 3      PHYSICAL EXAM:  Vitals:    04/16/18 0805   BP: 110/58   BP Cuff Size: Small   BP Site: Right Arm   BP Position: Sitting   Pulse: 62   Weight: 114 lb 4.8 oz (51.8 kg)   Height: 5\' 2"  (1.575 m)     Gen: Well-appearing, comfortable, pleasant, NAD  HEENT: Head atraumatic. Slera anicteric, no conjunctival injection.   Resp: Normal respiratory effort on ambient air.  GI: Soft, non-tender, non-distended. Normal BS. No guarding or rebound.   MSK: No joint erythema, tenderness, or swelling  Skin: No rashes or bruising  Neuro: A&Ox 3. Gait normal.    ASSESSMENT/PLAN: Melinda Odonnell is a 22 year old woman with no significant PMH who presents with concerns for flatulence/bloating.    1.  Bloating/Flatulence  Reports 6 mo of bloating/flatulence associated with pasta/beer.  I told Melinda Odonnell that it is very common for individuals to have these symptoms associated with certain foods and I recommend she create a food diary and document which foods trigger these symptoms. Low likelihood of lactose intolerance as patient eats cheese/ice cream without these symptoms. Will test for celiac disease. Other ddx includes IBS.   - Celiac Serology Reflex Panel (IgA tissue transglutaminase)  - Provided handouts on gas/bloating and celiac disease  - Suggested simethicone or anti-gas Maalox for symptom relief  - Encouraged food diary    I discussed this patient with Dr. Johnanna Schneiders for a problem focused visit     Johnston Ebbs, MD  Internal Medicine, PGY1

## 2018-04-16 NOTE — Patient Instructions (Addendum)
Thank you for visiting with me today. Here are the things I recommend from today's visit:    You were seen for bloating/gas/abdominal pain. Labs were performed to evaluate for celiac disease. I recommend you use simethicone or Maalox anti-gas if needed for bloating/gas and to create a food journal where you document the foods you eat and their effects.     Here are a few tips to help navigate your healthcare needs:     Refills:  Call your pharmacy at least 4 working days before you run out. Do not call the clinic for refills, it's quicker and safer to go through your pharmacy.     Test Results: Available in 1-2 weeks. I will contact you by eCare or letter unless there is something urgent, in which case I will call you sooner.     Urgent Symptoms:  Call [phone], day or night, and select option 2. Our clinic staff will help you during regular hours; after hours, our on-call nurses will help you.     Other Questions: Use eCare to securely message me. Please note that e-care messages are only read during office hours. If you have a long or complex question or a new issue, please make an appointment.  Call [phone] to sign-up for eCare or ask your MA to sign you up today.

## 2018-04-16 NOTE — Progress Notes (Signed)
I have personally discussed the case with the resident during or immediately after the patient visit including review of history, physical exam, diagnosis, and treatment plan. I agree with the assessment and plan of care.

## 2018-04-17 LAB — CELIAC SEROLOGY REFLEX PANEL
Anti Deaminated Gliadin, IgG: <1 U (ref 0–13)
Anti tTransglutaminase, IgA: <1 U (ref 0–13)

## 2018-08-04 ENCOUNTER — Other Ambulatory Visit: Payer: Self-pay

## 2018-08-31 ENCOUNTER — Encounter (HOSPITAL_BASED_OUTPATIENT_CLINIC_OR_DEPARTMENT_OTHER)
Payer: No Typology Code available for payment source | Admitting: Student in an Organized Health Care Education/Training Program

## 2018-09-13 NOTE — Progress Notes (Signed)
[place] VISIT    DATE:  09/13/2018    ID/CC: Melinda Odonnell is a 23 year old woman who presents for menstrual cramping.     INTERVAL HISTORY:    #Menstrual cramping  Melinda Odonnell had a ParaGard copper IUD placed 08/2017 at the women's clinic. After the IUD was placed, she noticed worsening cramps during her periods, which typically last 5-7 days and are associated with heavy bleeding on days 1-2. Over the last 6 months, she has also noted cramping 1-2 weeks before her period starts, thus she only has 1-1.5 weeks during the month when she does not experience menstrual cramps. She believes this is due to the copper IUD. She is not interested in an oral contraceptive since she can never remember to take it. She is sexually active with one partner and does not use condoms and declines STD testing at this time. She has no symptoms of worsening vaginal discharge or fevers. She uses ibuprofen and heat pad for the pain.     Medications    Current Outpatient Medications:   .  Cholecalciferol (VITAMIN D OR), , Disp: , Rfl:   .  Copper (ParaGard) Intrauterine Device, Insert 1 Intra Uterine Device into the uterus., Disp: , Rfl:   .  Spironolactone 50 MG Oral Tab, Take 1 tablet daily for 2 weeks then increase to 2 tablets daily, Disp: 180 tablet, Rfl: 3    PHYSICAL EXAM:  Vitals:    09/14/18 1039   BP: 136/80   BP Cuff Size: Regular   BP Site: Right Arm   BP Position: Sitting   Pulse: 98   Weight: 116 lb 4.8 oz (52.8 kg)     Gen: Well-appearing, comfortable, pleasant, NAD  HEENT: Head atraumatic. Slera anicteric, no conjunctival injection.   Resp: Normal respiratory effort on ambient air.   GU: declined at this time, she would like to be seen in women's clinic  GI: Soft, non-tender, non-distended.   Skin: Slightly erythematous rash under left axilla, non-painful, no areas of fluctuance   Neuro: A&Ox 3. Gait normal.  Psych: Very pleasant     ASSESSMENT/PLAN: Melinda Odonnell is a 23 year old woman who presents for menstrual cramping.      1. Severe menstrual cramps  Worsening menstrual cramps after placement of ParaGard IUD. I counseled the patient that the Copper IUD can often result in heavier periods and worsening menstrual cramps. Another possibility would be that the IUD is displaced. She will follow up with women's clinic regarding exchanging the Copper IUD for a Mirena. She is not interested in oral contraceptive pills since she forgets to take them. She declined STD (neg for GC/Chlam and HIV in 2018) testing at this time.    -Follow up with Women's clinic to replace Copper IUD with Mirena  -If symptoms worsen, consider transvaginal US to evaluate for IUD displacement     2. Fungal dermatitis  As Melinda Odonnell was about to leave she asked me about a non-painful, slightly erythematous rash in her left armpit. I prescribed nystatin powder for possible fungal rash and asked her to return if it does not improve.   - nystatin 100000 UNIT/GM powder; Apply to affected area on arm(s) 2 times a day. Apply to armpit  Dispense: 1 bottle; Refill: 0    I discussed this patient with Dr. Chiusano Mantis for a problem focused visit     Johnston Ebbs, MD  Internal Medicine, PGY2

## 2018-09-14 ENCOUNTER — Ambulatory Visit
Payer: No Typology Code available for payment source | Attending: Internal Medicine | Admitting: Student in an Organized Health Care Education/Training Program

## 2018-09-14 VITALS — BP 136/80 | HR 98 | Wt 116.3 lb

## 2018-09-14 DIAGNOSIS — Z6821 Body mass index (BMI) 21.0-21.9, adult: Secondary | ICD-10-CM

## 2018-09-14 DIAGNOSIS — B369 Superficial mycosis, unspecified: Secondary | ICD-10-CM | POA: Insufficient documentation

## 2018-09-14 DIAGNOSIS — N946 Dysmenorrhea, unspecified: Secondary | ICD-10-CM | POA: Insufficient documentation

## 2018-09-14 MED ORDER — NYSTATIN 100000 UNIT/GM EX POWD
Freq: Two times a day (BID) | CUTANEOUS | 0 refills | Status: AC
Start: 2018-09-14 — End: 2018-10-14

## 2018-09-14 NOTE — Progress Notes (Signed)
I have personally discussed the case with the resident during or immediately after the patient visit including review of history, physical exam, diagnosis, and treatment plan. I agree with the assessment and plan of care.

## 2018-09-14 NOTE — Patient Instructions (Addendum)
Thank you for visiting with me today. Here are the things I recommend from today's visit:    You were seen for cramping abdominal pain associated with your periods. It is likely the cramps are associated with the ParaGard IUD. I recommend you return to women's clinic to evaluate whether the IUD is displaced, and/or to replace the Copper IUD with a Mirena IUD, which is less associated with heavy cramping.       Here are a few tips to help navigate your healthcare needs:     Refills:  Call your pharmacy at least 4 working days before you run out. Do not call the clinic for refills, it's quicker and safer to go through your pharmacy.     Test Results: Available in 1-2 weeks. I will contact you by eCare or letter unless there is something urgent, in which case I will call you sooner.     Urgent Symptoms:  Call [phone], day or night, and select option 2. Our clinic staff will help you during regular hours; after hours, our on-call nurses will help you.     Other Questions: Use eCare to securely message me. Please note that e-care messages are only read during office hours. If you have a long or complex question or a new issue, please make an appointment.  Call [phone] to sign-up for eCare or ask your MA to sign you up today.

## 2018-10-15 ENCOUNTER — Ambulatory Visit
Payer: No Typology Code available for payment source | Attending: Internal Medicine | Admitting: Student in an Organized Health Care Education/Training Program

## 2018-10-15 VITALS — BP 131/70 | HR 64 | Wt 114.0 lb

## 2018-10-15 DIAGNOSIS — R200 Anesthesia of skin: Secondary | ICD-10-CM | POA: Insufficient documentation

## 2018-10-15 DIAGNOSIS — Z6820 Body mass index (BMI) 20.0-20.9, adult: Secondary | ICD-10-CM

## 2018-10-15 NOTE — Patient Instructions (Signed)
Thank you for visiting with me today. Here are the things I recommend from today's visit:    You were seen in clinic for left arm numbness/weakness. You were referred for an EMG. I recommend massage therapy and ibuprofen for the pain. You can also try OTC lidocaine patches    Here are a few tips to help navigate your healthcare needs:     Refills:  Call your pharmacy at least 4 working days before you run out. Do not call the clinic for refills, it's quicker and safer to go through your pharmacy.     Test Results: Available in 1-2 weeks. I will contact you by eCare or letter unless there is something urgent, in which case I will call you sooner.     Urgent Symptoms:  Call [phone], day or night, and select option 2. Our clinic staff will help you during regular hours; after hours, our on-call nurses will help you.     Other Questions: Use eCare to securely message me. Please note that e-care messages are only read during office hours. If you have a long or complex question or a new issue, please make an appointment.  Call [phone] to sign-up for eCare or ask your MA to sign you up today.

## 2018-10-15 NOTE — Progress Notes (Signed)
[address] VISIT    DATE:  10/15/2018    ID/CC: Melinda Odonnell is a 23 year old woman who presents for left arm numbness/pain.     INTERVAL HISTORY:    #Left arm pain/numbness  She was seen for left arm paresthesias/pain last summer. Numbness extended from shoulder to hand. It is not constant but comes and goes. She reports left arm/hand now seem slightly weaker. She did have these symptoms a few years ago in the same arm in [place] and imaging showed a benign mass that was removed near her L neck/ear. I do not have access to these records. She denies any trauma to the site or inciting factors. MRI of C spine showed no significant central canal stenosis or soft tissue mass. She was referred to massage therapy but insurance does not seem to cover it. She has tried alleve and hot packs.     Medications    Current Outpatient Medications:   .  Cholecalciferol (VITAMIN D OR), , Disp: , Rfl:   .  Copper (ParaGard) Intrauterine Device, Insert 1 Intra Uterine Device into the uterus., Disp: , Rfl:   .  Spironolactone 50 MG Oral Tab, Take 1 tablet daily for 2 weeks then increase to 2 tablets daily, Disp: 180 tablet, Rfl: 3      PHYSICAL EXAM:    Vitals:    10/15/18 1404   BP: 131/70   BP Cuff Size: Small   BP Site: Right Arm   BP Position: Sitting   Pulse: 64   Weight: 114 lb (51.7 kg)      Gen: Well-appearing, comfortable, pleasant, NAD  HEENT: Head atraumatic. Slera anicteric, no conjunctival injection.   CV: Normal S1 and S2. RRR. No m/r/g. No LE edema.  Resp: Normal respiratory effort. Lungs clear to auscultation bilaterally.   Left Shoulder/upper back: No asymmetry, scars, erythema, masses, muscle wasting or scapular winging. No pain on palpation of AC joint, biceps tendon, or cervical spine. Full active and passive ROM in L shoulder. Neer, Hawkins, and empty can tests negative. 5/5 strength in bl shoulders/forearms/hands.     Imaging:   MRI Cervical Spine without contrast: degenerative (C 1)  Non-contrast: Sagittal T1,T2.  Axial 3D T2.    COMPARISON:  None.    FINDINGS:  ALIGNMENT: Normal  MARROW: Normal  DISCS: Discs are normal in height and signal intensity.   CORD: Visualized spinal cord is normal in signal and size.  PARAVERTEBRAL SOFT TISSUES: Normal    AXIAL DISCS, DURAL COMPRESSION & FORAMINA:  C2-3: No central or foraminal stenosis. Facets are normal.   C3-4: No central or foraminal stenosis. Facets are normal.   C4-5: No central or foraminal stenosis. Facets are normal.   C5-6: Trace disc osteophyte complex without significant central canal   stenosis or neuroforaminal stenosis. Normal facets.   C6-7: Trace disc osteophyte complex without significant central canal   stenosis or neuroforaminal stenosis. Normal facets.   C7-T1: No central or foraminal stenosis. Facets are normal.     ATTENDING RADIOLOGIST AND PAGER NUMBER  [id number] Norma Fredrickson MD  [phone]      Impression     IMPRESSION:  No significant central canal stenosis or neuroforaminal stenosis.    No prevertebral or paraspinous soft tissue mass.                ASSESSMENT/PLAN: Melinda Odonnell is a 23 year old woman who presents for left arm numbness/pain.     1. Arm numbness left  Unclear etiology. CT spine imaging showed no evidence of central canal stenosis or soft tissue masses. Will refer to EMG to evaluate for nerve damage. If there is damage, will consider further workup/imaging. Her left shoulder/arm exam is quite reassuring.    - REFERRAL TO EMG  - Ibuprofen for pain    RTC: 1 month after results of EMG have returned    I discussed this patient with Dr. Florene Glen for a problem focused visit     Johnston Ebbs, MD  Internal Medicine, PGY2

## 2018-10-15 NOTE — Progress Notes (Signed)
I have personally discussed the case with the resident during or immediately after the patient visit including review of history, physical exam, diagnosis, and treatment plan. I agree with the assessment and plan of care.

## 2018-11-05 ENCOUNTER — Encounter (HOSPITAL_BASED_OUTPATIENT_CLINIC_OR_DEPARTMENT_OTHER): Payer: Self-pay | Admitting: Student in an Organized Health Care Education/Training Program

## 2018-11-05 NOTE — Telephone Encounter (Signed)
Routing to the [employer].

## 2018-11-08 ENCOUNTER — Ambulatory Visit
Payer: No Typology Code available for payment source | Attending: Internal Medicine | Admitting: Neurology with Special Qualifications in Child Neurology

## 2019-05-16 ENCOUNTER — Encounter (HOSPITAL_BASED_OUTPATIENT_CLINIC_OR_DEPARTMENT_OTHER)
Payer: No Typology Code available for payment source | Admitting: Student in an Organized Health Care Education/Training Program

## 2019-05-16 ENCOUNTER — Encounter (HOSPITAL_BASED_OUTPATIENT_CLINIC_OR_DEPARTMENT_OTHER): Payer: PRIVATE HEALTH INSURANCE | Admitting: Internal Medicine

## 2019-06-28 ENCOUNTER — Ambulatory Visit
Payer: PRIVATE HEALTH INSURANCE | Attending: Unknown Physician Specialty | Admitting: Student in an Organized Health Care Education/Training Program

## 2019-06-28 VITALS — BP 124/76 | HR 73 | Temp 99.4°F | Wt 113.6 lb

## 2019-06-28 DIAGNOSIS — Z23 Encounter for immunization: Secondary | ICD-10-CM | POA: Insufficient documentation

## 2019-06-28 DIAGNOSIS — R221 Localized swelling, mass and lump, neck: Secondary | ICD-10-CM | POA: Insufficient documentation

## 2019-06-28 NOTE — Patient Instructions (Addendum)
Please follow up with ENT for further evaluation of the neck mass.

## 2019-06-28 NOTE — Progress Notes (Signed)
[facility] VISIT    DATE:  06/28/2019    ID/CC: Elyna Iwanaga is a 23 year old woman who presents for a neck mass.     INTERVAL HISTORY:    #Neck mass  Jeneen reports she noticed a mass on her left upper neck below her left ear and near the jawline about 2 months ago. It is "pea sized" and very slowly enlarging. She denies URI symptoms, tobacco use, weight loss/night sweats. She reports she had a benign tumor removed in this same area by ENT 4 years ago in [place]. She was told by ENT that it would likely reoccur. We do not have those records.     #HCM - due for influenza vaccine    Medications    Current Outpatient Medications:   .  Cholecalciferol (VITAMIN D OR), , Disp: , Rfl:   .  Copper (ParaGard) Intrauterine Device, Insert 1 Intra Uterine Device into the uterus., Disp: , Rfl:   .  Spironolactone 50 MG Oral Tab, Take 1 tablet daily for 2 weeks then increase to 2 tablets daily, Disp: 180 tablet, Rfl: 3    PHYSICAL EXAM:  Vitals:    06/28/19 1118   BP: 124/76   BP Cuff Size: Small   BP Site: Right Arm   BP Position: Sitting   Pulse: 73   Temp: 99.4 F (37.4 C)   TempSrc: Temporal   Weight: 113 lb 9.6 oz (51.5 kg)     Gen: Well-appearing, comfortable, pleasant, NAD  HEENT: Head atraumatic. Slera anicteric, no conjunctival injection. Oropharynx clear, mucus membranes moist. Thyroid smooth, symmetric without nodules. 0.5 cm mobile mass below her left ear near the jawline, non-painful, no erythema, not fixed.   Neuro: A&Ox 3. Gait normal.    ASSESSMENT/PLAN: Lesleigh Prestigiacomo is a 23 year old woman who presents for a neck mass    1. Neck mass  Patient reports neck mass was removed 4 years ago by ENT in [place] and was told it was a benign tumor which was likely to reoccur. We will work on obtaining those records. Currently she has a very small unconcerning 0.5 cm mobile mass. I referred her to ENT for further evaluation. No current signs/symptoms of malignancy or infection.   - REFERRAL TO OTO-HEAD NECK  SURGERY    I discussed this patient with Dr. Ignatius Specking for a problem focused visit     Johnston Ebbs, MD  Internal Medicine, PGY3

## 2019-07-01 NOTE — Progress Notes (Signed)
I have personally discussed the case with the resident during or immediately after the patient visit including review of history, physical exam, diagnosis, and treatment plan. I agree with the assessment and plan of care    Aura Bibby, MD  Clinical Instructor  Department of Medicine

## 2019-08-01 ENCOUNTER — Ambulatory Visit: Payer: PRIVATE HEALTH INSURANCE | Attending: Otolaryngology | Admitting: Otolaryngology

## 2019-08-01 VITALS — BP 115/70 | HR 84 | Temp 98.7°F | Ht 62.0 in | Wt 114.6 lb

## 2019-08-01 DIAGNOSIS — D110 Benign neoplasm of parotid gland: Secondary | ICD-10-CM | POA: Insufficient documentation

## 2019-08-01 NOTE — Progress Notes (Signed)
[facility] NOTE    DATE: 08/01/2019     Melinda Odonnell is a 23 year old female who is here today for recurrent left parotid nodules.     PROBLEM LIST:  Patient Active Problem List    Diagnosis Date Noted   . Neck mass 06/28/2019   . Severe menstrual cramps 09/14/2018     CHIEF COMPLAINT: New Patient (Neck mass- swelling on L side for 3-4 months )    INTERVAL HISTORY/HPI:  History of benign parotid tumor s/p left parotidectomy in [place] 2 years ago. It had grown to 4-5 cm in size per patient (she had delay in getting referrals to see surgeons). She thinks it was a pleomorphic adenoma but not sure. Recently she has felt some nodularity in the left infra-auricular region. After prior surgery she had no facial weakness, only slight ear numbness.    PAST MEDICAL HISTORY:  No past medical history on file.    MEDICATIONS:  Current Outpatient Medications   Medication Sig Dispense Refill   . Cholecalciferol (VITAMIN D OR)      . Copper (ParaGard) Intrauterine Device Insert 1 Intra Uterine Device into the uterus.     Marland Kitchen Spironolactone 50 MG Oral Tab Take 1 tablet daily for 2 weeks then increase to 2 tablets daily (Patient not taking: Reported on 08/01/2019) 180 tablet 3     No current facility-administered medications for this visit.        ALLERGIES:  Review of patient's allergies indicates:  Allergies   Allergen Reactions   . Doxycycline Swelling       FAMILY MEDICAL HISTORY:  Unremarkable for head and neck masses    SOCIAL HISTORY:  No tobacco, occasional ETOH.    REVIEW OF SYSTEMS:  Unless stated, negative for:  Neuro: headache  Eyes: dry eyes, vision changes, diplopia  Nose: rhinorrhea, epistaxis, smell changes  Ears: hearing loss, vertigo, tinnitus  Mouth: dental pain, mouth lesions, dysphagia, dysarthria  Throat: aspiration, dyspnea, hemoptysis  Neck: lymphadenopathy    PHYSICAL EXAM:  BP 115/70   Pulse 84   Temp 98.7 F (37.1 C) (Temporal)   Ht 5\' 2"   (1.575 m)   Wt 114 lb 9.6 oz (52 kg)   SpO2 99%   BMI 20.96 kg/m   General - well appearing, no apparent distress, speaking full sentences, alert and oriented x 3  Ears - normal auricles, canals, tympanic membranes  Eyes - conjunctiva pink, pupils round equal and reactive to light  Neuro - cranial nerves II-XII intact  Nose - septum midline, mucosa moist and pink, no nasal lesions  Oral cavity - normal lips and mucosal membranes, good dentition, tongue without lesions and soft  Pharynx - tonsils 2+, midline uvula, no lesions  Neck - no lymphadenopathy, left infra-auricular (nearly retroauricular) region with slight fullness in 1 cm region. Well healed scar.    In clinic ultrasound demonstrates 5 sub cm hypoechoic nodules in the left infra-auricular region with little remnant parotid tissue. No pathologic cervical lymph nodes.    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     Will place order for formal neck ultrasound and US guided biopsy by IR, then will present to tumor board. I plan to do the left revision parotidectomy with Dr. Stephanie Coup with possible fat graft or alloderm placement. I discussed the risks, benefits, and alternatives. The main risks are pain, bleeding, infection, facial weakness,  recurrence, damage to adjacent structures including arteries, veins, and nerves, recurrence of issues, scar, Frey's syndrome, and first bite syndrome.    Thank you kindly for the opportunity to partake in the patient's care.

## 2019-08-06 ENCOUNTER — Telehealth (HOSPITAL_BASED_OUTPATIENT_CLINIC_OR_DEPARTMENT_OTHER): Payer: Self-pay | Admitting: Otolaryngology

## 2019-08-06 NOTE — Telephone Encounter (Signed)
RETURN CALL: Voicemail - Detailed Message      SUBJECT:  Appointment Request     REASON FOR VISIT: Biopsy  PREFERRED DATE/TIME: TBD  ADDITIONAL INFORMATION: CCR not authorized to schedule per SM, please advise.

## 2019-08-06 NOTE — Telephone Encounter (Signed)
Spoke to pt regarding- waiting for [facility] for surgery codes.

## 2019-08-16 ENCOUNTER — Ambulatory Visit: Payer: PRIVATE HEALTH INSURANCE | Attending: Otolaryngology

## 2019-08-16 DIAGNOSIS — D110 Benign neoplasm of parotid gland: Secondary | ICD-10-CM

## 2019-08-19 ENCOUNTER — Encounter (HOSPITAL_BASED_OUTPATIENT_CLINIC_OR_DEPARTMENT_OTHER): Payer: Self-pay | Admitting: Otolaryngology

## 2019-08-19 ENCOUNTER — Encounter (HOSPITAL_BASED_OUTPATIENT_CLINIC_OR_DEPARTMENT_OTHER): Payer: Self-pay | Admitting: Plastic Surgery within the Head & Neck

## 2019-08-21 ENCOUNTER — Other Ambulatory Visit (HOSPITAL_BASED_OUTPATIENT_CLINIC_OR_DEPARTMENT_OTHER): Payer: Self-pay | Admitting: Plastic Surgery within the Head & Neck

## 2019-08-21 DIAGNOSIS — Z01818 Encounter for other preprocedural examination: Secondary | ICD-10-CM

## 2019-08-22 ENCOUNTER — Ambulatory Visit: Payer: PRIVATE HEALTH INSURANCE | Attending: Otolaryngology | Admitting: Otolaryngology

## 2019-08-22 VITALS — BP 110/64 | HR 63 | Temp 98.1°F | Ht 62.0 in | Wt 114.4 lb

## 2019-08-22 DIAGNOSIS — D490 Neoplasm of unspecified behavior of digestive system: Secondary | ICD-10-CM | POA: Insufficient documentation

## 2019-08-22 NOTE — Progress Notes (Signed)
[facility] [facility] [place] NOTE    DATE: 08/01/2019     Melinda Odonnell is a 23 year old female who is here today for f/u left parotid gland. Previously concern for recurrent pleomorphic adenoma.    PROBLEM LIST:  Patient Active Problem List    Diagnosis Date Noted   . Neck mass 06/28/2019   . Severe menstrual cramps 09/14/2018     CHIEF COMPLAINT: Procedure (Patient being seen for FNA procedure. )    INTERVAL HISTORY/HPI:  States fullness left infra-auricular region has improved. Left neck US shows:    In level II A, left infra-auricular region (area of concern), at least six   hypoechoic nodules, the large majority being sub-centimeter are seen. Most are   ellipsoid in shape and  described as below.  Largest three as below.     Lesion 1: Cervical level 2 A; 1.7 x 1.6 x 0.6 cm  Location: lateral to common carotid artery  Sonographic features: hypoechoic, ellipsoid in shape, minimal peripheral   vascularity, no fatty hilum displayed     Lesion 2: Cervical level 2A - superior; 1.0 x 0.8 x 0.2 cm  Location: [medial, lateral] to common carotid artery  Sonographic features: very hypoechoic, ellipsoid in shape, avascular, no fatty   hilum displayed     Lesion 3: Cervical level 2A inferior; 1.1 x 0.9 x 0.4 cm  Location: lateral to common carotid artery  Sonographic features: hypoechoic, ellipsoid in shape, avascular, possible   fatty hilum displayed     *At least two similar appearing areas are seen in the right infra-auricular   region.     Impression  =========     Multiple prominent lymph nodes in the left level II region without fatty hilum   and without color flow on Doppler. There are several small lymph nodes in the   right infra-auricular  region as well. These lymph nodes are likely reactive in etiology. Given   patient's history of resected left parotid tumor, could consider follow-up   ultrasound in 6 months to  reassess for interval change.    PAST MEDICAL HISTORY:  No past  medical history on file.    MEDICATIONS:  Current Outpatient Medications   Medication Sig Dispense Refill   . Cholecalciferol (VITAMIN D OR)      . Copper (ParaGard) Intrauterine Device Insert 1 Intra Uterine Device into the uterus.     Marland Kitchen Spironolactone 50 MG Oral Tab Take 1 tablet daily for 2 weeks then increase to 2 tablets daily (Patient not taking: Reported on 08/01/2019) 180 tablet 3     No current facility-administered medications for this visit.        ALLERGIES:  Review of patient's allergies indicates:  Allergies   Allergen Reactions   . Doxycycline Swelling       FAMILY MEDICAL HISTORY:  Unremarkable for head and neck masses    SOCIAL HISTORY:  No tobacco, occasional ETOH.    REVIEW OF SYSTEMS:  Unless stated, negative for:  Neuro: headache  Eyes: dry eyes, vision changes, diplopia  Nose: rhinorrhea, epistaxis, smell changes  Ears: hearing loss, vertigo, tinnitus  Mouth: dental pain, mouth lesions, dysphagia, dysarthria  Throat: aspiration, dyspnea, hemoptysis  Neck: lymphadenopathy    PHYSICAL EXAM:  BP 110/64   Pulse 63   Temp 98.1 F (36.7 C) (Temporal)   Ht 5\' 2"  (1.575 m)   Wt 114 lb 6.4 oz (51.9 kg)   SpO2 99%   BMI  20.92 kg/m   General - well appearing, no apparent distress, speaking full sentences, alert and oriented x 3  Ears - normal auricles, canals, tympanic membranes  Eyes - conjunctiva pink, pupils round equal and reactive to light  Neuro - cranial nerves II-XII intact  Nose - septum midline, mucosa moist and pink, no nasal lesions  Oral cavity - normal lips and mucosal membranes, good dentition, tongue without lesions and soft  Pharynx - tonsils 2+, midline uvula, no lesions  Neck - no lymphadenopathy, left infra-auricular (nearly retroauricular) region with improved fullness. Flat. Well healed scar.    In clinic ultrasound demonstrates 5 sub cm hypoechoic elliptical lymph nodes (done with Dr. Gerhard Munch) in the left infra-auricular region with little remnant parotid tissue.     LAB  RESULTS:  n/a    ASSESSMENT AND PLAN:  No evidence of disease. Will cancel surgery for now as we will just monitor. Plan to f/u with me in clinic in 6 months and plan to repeat in clinic [place] at that time.    Cristy Hilts, MD  Fellow  Head and Neck Oncologic and Reconstructive Surgery

## 2019-09-29 ENCOUNTER — Ambulatory Visit (HOSPITAL_BASED_OUTPATIENT_CLINIC_OR_DEPARTMENT_OTHER): Payer: Self-pay

## 2019-10-01 ENCOUNTER — Ambulatory Visit (HOSPITAL_COMMUNITY)
Admission: RE | Admit: 2019-10-01 | Payer: PRIVATE HEALTH INSURANCE | Source: Home / Self Care | Admitting: Otolaryngology

## 2019-10-10 ENCOUNTER — Encounter (HOSPITAL_BASED_OUTPATIENT_CLINIC_OR_DEPARTMENT_OTHER): Payer: PRIVATE HEALTH INSURANCE | Admitting: Otolaryngology

## 2019-10-10 ENCOUNTER — Telehealth (HOSPITAL_BASED_OUTPATIENT_CLINIC_OR_DEPARTMENT_OTHER): Payer: Self-pay | Admitting: Student in an Organized Health Care Education/Training Program

## 2019-10-10 NOTE — Telephone Encounter (Signed)
RETURN CALL: Voicemail - Detailed Message      SUBJECT:Reschedule Request     REASON: Patient states they overbooked themselves and I have another appointment to get to.  ADDITIONAL INFORMATION:   CCR cancelled appointment per patient's request. CCR unable to appoint per patient's request. CCR attempted to transfer caller to the clinic front desk. Patient is requesting a sooner appointment.  Patient states Friday's typically work best.

## 2019-10-11 ENCOUNTER — Ambulatory Visit (HOSPITAL_BASED_OUTPATIENT_CLINIC_OR_DEPARTMENT_OTHER): Payer: PRIVATE HEALTH INSURANCE | Admitting: Student in an Organized Health Care Education/Training Program

## 2019-10-11 NOTE — Telephone Encounter (Signed)
LVM for patient to call and schedule a follow up

## 2020-07-16 ENCOUNTER — Other Ambulatory Visit: Payer: Self-pay

## 2021-06-09 ENCOUNTER — Encounter (HOSPITAL_BASED_OUTPATIENT_CLINIC_OR_DEPARTMENT_OTHER): Payer: Self-pay

## 2021-06-27 ENCOUNTER — Other Ambulatory Visit: Payer: Self-pay

## 2021-11-17 ENCOUNTER — Encounter (HOSPITAL_BASED_OUTPATIENT_CLINIC_OR_DEPARTMENT_OTHER): Payer: Self-pay

## 2022-03-16 ENCOUNTER — Encounter (HOSPITAL_BASED_OUTPATIENT_CLINIC_OR_DEPARTMENT_OTHER): Payer: Self-pay

## 2022-03-24 ENCOUNTER — Encounter (HOSPITAL_BASED_OUTPATIENT_CLINIC_OR_DEPARTMENT_OTHER): Payer: Self-pay

## 2022-06-06 ENCOUNTER — Encounter (HOSPITAL_BASED_OUTPATIENT_CLINIC_OR_DEPARTMENT_OTHER): Payer: Self-pay | Admitting: Unknown Physician Specialty

## 2022-06-30 ENCOUNTER — Other Ambulatory Visit: Payer: Self-pay
